# Patient Record
Sex: Male | Born: 1935 | Race: White | Hispanic: No | Marital: Married | Smoking: Former smoker
Health system: Southern US, Community
[De-identification: ages and names within clinical notes are randomized; demographics above are authoritative.]

## PROBLEM LIST (undated history)

## (undated) DIAGNOSIS — G7100 Muscular dystrophy, unspecified: Secondary | ICD-10-CM

## (undated) DIAGNOSIS — Z87891 Personal history of nicotine dependence: Secondary | ICD-10-CM

## (undated) DIAGNOSIS — F0390 Unspecified dementia without behavioral disturbance: Secondary | ICD-10-CM

## (undated) DIAGNOSIS — I2510 Atherosclerotic heart disease of native coronary artery without angina pectoris: Secondary | ICD-10-CM

## (undated) DIAGNOSIS — I10 Essential (primary) hypertension: Secondary | ICD-10-CM

## (undated) DIAGNOSIS — M1990 Unspecified osteoarthritis, unspecified site: Secondary | ICD-10-CM

## (undated) DIAGNOSIS — E785 Hyperlipidemia, unspecified: Secondary | ICD-10-CM

## (undated) DIAGNOSIS — K219 Gastro-esophageal reflux disease without esophagitis: Secondary | ICD-10-CM

## (undated) DIAGNOSIS — R972 Elevated prostate specific antigen [PSA]: Secondary | ICD-10-CM

## (undated) DIAGNOSIS — I493 Ventricular premature depolarization: Secondary | ICD-10-CM

## (undated) DIAGNOSIS — I219 Acute myocardial infarction, unspecified: Secondary | ICD-10-CM

## (undated) DIAGNOSIS — K635 Polyp of colon: Secondary | ICD-10-CM

## (undated) DIAGNOSIS — R001 Bradycardia, unspecified: Secondary | ICD-10-CM

## (undated) DIAGNOSIS — K5790 Diverticulosis of intestine, part unspecified, without perforation or abscess without bleeding: Secondary | ICD-10-CM

## (undated) HISTORY — DX: Diverticulosis of intestine, part unspecified, without perforation or abscess without bleeding: K57.90

## (undated) HISTORY — DX: Personal history of nicotine dependence: Z87.891

## (undated) HISTORY — DX: Unspecified osteoarthritis, unspecified site: M19.90

## (undated) HISTORY — DX: Muscular dystrophy, unspecified: G71.00

## (undated) HISTORY — DX: Acute myocardial infarction, unspecified: I21.9

## (undated) HISTORY — DX: Ventricular premature depolarization: I49.3

## (undated) HISTORY — DX: Elevated prostate specific antigen (PSA): R97.20

## (undated) HISTORY — DX: Polyp of colon: K63.5

## (undated) HISTORY — PX: ROTATOR CUFF REPAIR: SHX139

## (undated) HISTORY — PX: OTHER SURGICAL HISTORY: SHX169

---

## 1991-02-04 HISTORY — PX: CORONARY ARTERY BYPASS GRAFT: SHX141

## 2003-03-15 ENCOUNTER — Encounter: Admission: RE | Admit: 2003-03-15 | Discharge: 2003-03-15 | Payer: Self-pay | Admitting: Specialist

## 2003-03-17 ENCOUNTER — Inpatient Hospital Stay (HOSPITAL_COMMUNITY): Admission: RE | Admit: 2003-03-17 | Discharge: 2003-03-21 | Payer: Self-pay | Admitting: Specialist

## 2003-03-21 ENCOUNTER — Inpatient Hospital Stay (HOSPITAL_COMMUNITY)
Admission: RE | Admit: 2003-03-21 | Discharge: 2003-03-29 | Payer: Self-pay | Admitting: Physical Medicine & Rehabilitation

## 2010-05-05 HISTORY — PX: OTHER SURGICAL HISTORY: SHX169

## 2014-12-27 DIAGNOSIS — I2510 Atherosclerotic heart disease of native coronary artery without angina pectoris: Secondary | ICD-10-CM

## 2014-12-27 DIAGNOSIS — Z951 Presence of aortocoronary bypass graft: Secondary | ICD-10-CM | POA: Insufficient documentation

## 2014-12-27 DIAGNOSIS — E785 Hyperlipidemia, unspecified: Secondary | ICD-10-CM | POA: Insufficient documentation

## 2014-12-27 DIAGNOSIS — I10 Essential (primary) hypertension: Secondary | ICD-10-CM | POA: Insufficient documentation

## 2014-12-27 HISTORY — DX: Hyperlipidemia, unspecified: E78.5

## 2016-09-24 DIAGNOSIS — Z955 Presence of coronary angioplasty implant and graft: Secondary | ICD-10-CM | POA: Insufficient documentation

## 2016-11-10 DIAGNOSIS — J323 Chronic sphenoidal sinusitis: Secondary | ICD-10-CM | POA: Insufficient documentation

## 2016-11-10 DIAGNOSIS — J329 Chronic sinusitis, unspecified: Secondary | ICD-10-CM

## 2016-11-10 HISTORY — DX: Chronic sinusitis, unspecified: J32.9

## 2016-11-17 ENCOUNTER — Other Ambulatory Visit: Payer: Self-pay | Admitting: Cardiology

## 2017-01-05 DIAGNOSIS — J3802 Paralysis of vocal cords and larynx, bilateral: Secondary | ICD-10-CM | POA: Insufficient documentation

## 2017-01-05 DIAGNOSIS — G129 Spinal muscular atrophy, unspecified: Secondary | ICD-10-CM | POA: Insufficient documentation

## 2020-07-06 ENCOUNTER — Other Ambulatory Visit: Payer: Self-pay

## 2020-07-06 ENCOUNTER — Inpatient Hospital Stay (HOSPITAL_COMMUNITY)
Admission: AD | Admit: 2020-07-06 | Discharge: 2020-07-20 | DRG: 243 | Disposition: A | Discharge status: Skilled Nursing Facility | Payer: No Typology Code available for payment source | Source: Other Acute Inpatient Hospital | Attending: Family Medicine | Admitting: Family Medicine

## 2020-07-06 ENCOUNTER — Encounter (HOSPITAL_COMMUNITY): Payer: Self-pay

## 2020-07-06 DIAGNOSIS — R001 Bradycardia, unspecified: Secondary | ICD-10-CM | POA: Diagnosis present

## 2020-07-06 DIAGNOSIS — T447X5A Adverse effect of beta-adrenoreceptor antagonists, initial encounter: Secondary | ICD-10-CM | POA: Diagnosis present

## 2020-07-06 DIAGNOSIS — M25461 Effusion, right knee: Secondary | ICD-10-CM | POA: Diagnosis not present

## 2020-07-06 DIAGNOSIS — M25572 Pain in left ankle and joints of left foot: Secondary | ICD-10-CM | POA: Diagnosis not present

## 2020-07-06 DIAGNOSIS — M109 Gout, unspecified: Secondary | ICD-10-CM

## 2020-07-06 DIAGNOSIS — G7100 Muscular dystrophy, unspecified: Secondary | ICD-10-CM | POA: Diagnosis present

## 2020-07-06 DIAGNOSIS — E785 Hyperlipidemia, unspecified: Secondary | ICD-10-CM | POA: Diagnosis present

## 2020-07-06 DIAGNOSIS — M25569 Pain in unspecified knee: Secondary | ICD-10-CM

## 2020-07-06 DIAGNOSIS — I441 Atrioventricular block, second degree: Secondary | ICD-10-CM | POA: Diagnosis present

## 2020-07-06 DIAGNOSIS — K5900 Constipation, unspecified: Secondary | ICD-10-CM

## 2020-07-06 DIAGNOSIS — Z20822 Contact with and (suspected) exposure to covid-19: Secondary | ICD-10-CM | POA: Diagnosis present

## 2020-07-06 DIAGNOSIS — Z950 Presence of cardiac pacemaker: Secondary | ICD-10-CM

## 2020-07-06 DIAGNOSIS — I453 Trifascicular block: Secondary | ICD-10-CM | POA: Diagnosis present

## 2020-07-06 DIAGNOSIS — M80062A Age-related osteoporosis with current pathological fracture, left lower leg, initial encounter for fracture: Secondary | ICD-10-CM | POA: Diagnosis present

## 2020-07-06 DIAGNOSIS — I2510 Atherosclerotic heart disease of native coronary artery without angina pectoris: Secondary | ICD-10-CM | POA: Diagnosis present

## 2020-07-06 DIAGNOSIS — N400 Enlarged prostate without lower urinary tract symptoms: Secondary | ICD-10-CM | POA: Diagnosis present

## 2020-07-06 DIAGNOSIS — G250 Essential tremor: Secondary | ICD-10-CM | POA: Diagnosis present

## 2020-07-06 DIAGNOSIS — I452 Bifascicular block: Secondary | ICD-10-CM | POA: Diagnosis present

## 2020-07-06 DIAGNOSIS — R112 Nausea with vomiting, unspecified: Secondary | ICD-10-CM | POA: Diagnosis not present

## 2020-07-06 DIAGNOSIS — R55 Syncope and collapse: Secondary | ICD-10-CM | POA: Diagnosis present

## 2020-07-06 DIAGNOSIS — N390 Urinary tract infection, site not specified: Secondary | ICD-10-CM | POA: Diagnosis not present

## 2020-07-06 DIAGNOSIS — D638 Anemia in other chronic diseases classified elsewhere: Secondary | ICD-10-CM | POA: Diagnosis present

## 2020-07-06 DIAGNOSIS — Z885 Allergy status to narcotic agent status: Secondary | ICD-10-CM

## 2020-07-06 DIAGNOSIS — G129 Spinal muscular atrophy, unspecified: Secondary | ICD-10-CM | POA: Diagnosis present

## 2020-07-06 DIAGNOSIS — Z7902 Long term (current) use of antithrombotics/antiplatelets: Secondary | ICD-10-CM | POA: Diagnosis not present

## 2020-07-06 DIAGNOSIS — Z87891 Personal history of nicotine dependence: Secondary | ICD-10-CM

## 2020-07-06 DIAGNOSIS — E871 Hypo-osmolality and hyponatremia: Secondary | ICD-10-CM | POA: Diagnosis not present

## 2020-07-06 DIAGNOSIS — K219 Gastro-esophageal reflux disease without esophagitis: Secondary | ICD-10-CM | POA: Diagnosis present

## 2020-07-06 DIAGNOSIS — D72829 Elevated white blood cell count, unspecified: Secondary | ICD-10-CM | POA: Diagnosis present

## 2020-07-06 DIAGNOSIS — W050XXA Fall from non-moving wheelchair, initial encounter: Secondary | ICD-10-CM | POA: Diagnosis present

## 2020-07-06 DIAGNOSIS — M25472 Effusion, left ankle: Secondary | ICD-10-CM

## 2020-07-06 DIAGNOSIS — Y92019 Unspecified place in single-family (private) house as the place of occurrence of the external cause: Secondary | ICD-10-CM

## 2020-07-06 DIAGNOSIS — T148XXA Other injury of unspecified body region, initial encounter: Secondary | ICD-10-CM

## 2020-07-06 DIAGNOSIS — I10 Essential (primary) hypertension: Secondary | ICD-10-CM

## 2020-07-06 DIAGNOSIS — I129 Hypertensive chronic kidney disease with stage 1 through stage 4 chronic kidney disease, or unspecified chronic kidney disease: Secondary | ICD-10-CM | POA: Diagnosis present

## 2020-07-06 DIAGNOSIS — I493 Ventricular premature depolarization: Secondary | ICD-10-CM

## 2020-07-06 DIAGNOSIS — Z79899 Other long term (current) drug therapy: Secondary | ICD-10-CM

## 2020-07-06 DIAGNOSIS — R197 Diarrhea, unspecified: Secondary | ICD-10-CM | POA: Diagnosis not present

## 2020-07-06 DIAGNOSIS — R251 Tremor, unspecified: Secondary | ICD-10-CM | POA: Diagnosis not present

## 2020-07-06 DIAGNOSIS — I498 Other specified cardiac arrhythmias: Secondary | ICD-10-CM

## 2020-07-06 DIAGNOSIS — I25810 Atherosclerosis of coronary artery bypass graft(s) without angina pectoris: Secondary | ICD-10-CM

## 2020-07-06 DIAGNOSIS — S82202A Unspecified fracture of shaft of left tibia, initial encounter for closed fracture: Secondary | ICD-10-CM | POA: Diagnosis present

## 2020-07-06 DIAGNOSIS — W19XXXA Unspecified fall, initial encounter: Secondary | ICD-10-CM

## 2020-07-06 DIAGNOSIS — N1832 Chronic kidney disease, stage 3b: Secondary | ICD-10-CM | POA: Diagnosis present

## 2020-07-06 DIAGNOSIS — Z880 Allergy status to penicillin: Secondary | ICD-10-CM

## 2020-07-06 DIAGNOSIS — Z993 Dependence on wheelchair: Secondary | ICD-10-CM

## 2020-07-06 DIAGNOSIS — R110 Nausea: Secondary | ICD-10-CM

## 2020-07-06 DIAGNOSIS — F0390 Unspecified dementia without behavioral disturbance: Secondary | ICD-10-CM | POA: Diagnosis present

## 2020-07-06 DIAGNOSIS — R1010 Upper abdominal pain, unspecified: Secondary | ICD-10-CM | POA: Diagnosis not present

## 2020-07-06 HISTORY — DX: Atherosclerotic heart disease of native coronary artery without angina pectoris: I25.10

## 2020-07-06 HISTORY — DX: Essential (primary) hypertension: I10

## 2020-07-06 HISTORY — DX: Gastro-esophageal reflux disease without esophagitis: K21.9

## 2020-07-06 HISTORY — DX: Bradycardia, unspecified: R00.1

## 2020-07-06 HISTORY — DX: Hyperlipidemia, unspecified: E78.5

## 2020-07-06 HISTORY — DX: Unspecified dementia, unspecified severity, without behavioral disturbance, psychotic disturbance, mood disturbance, and anxiety: F03.90

## 2020-07-06 MED ORDER — TAMSULOSIN HCL 0.4 MG PO CAPS
0.4000 mg | ORAL_CAPSULE | Freq: Every day | ORAL | Status: DC
  Administered 2020-07-06 – 2020-07-19 (×14): 0.4 mg via ORAL
  Filled 2020-07-06 (×14): qty 1

## 2020-07-06 MED ORDER — POLYETHYLENE GLYCOL 3350 17 G PO PACK
17.0000 g | PACK | Freq: Every day | ORAL | Status: DC
  Administered 2020-07-09 – 2020-07-19 (×8): 17 g via ORAL
  Filled 2020-07-06 (×11): qty 1

## 2020-07-06 MED ORDER — FUROSEMIDE 40 MG PO TABS
40.0000 mg | ORAL_TABLET | ORAL | Status: DC
  Administered 2020-07-09 – 2020-07-11 (×2): 40 mg via ORAL
  Filled 2020-07-06 (×2): qty 1

## 2020-07-06 MED ORDER — VITAMIN B-12 1000 MCG PO TABS
1000.0000 ug | ORAL_TABLET | Freq: Every morning | ORAL | Status: DC
  Administered 2020-07-07 – 2020-07-12 (×6): 1000 ug via ORAL
  Filled 2020-07-06 (×6): qty 1

## 2020-07-06 MED ORDER — MONTELUKAST SODIUM 10 MG PO TABS
10.0000 mg | ORAL_TABLET | Freq: Every morning | ORAL | Status: DC
  Administered 2020-07-07 – 2020-07-12 (×6): 10 mg via ORAL
  Filled 2020-07-06 (×6): qty 1

## 2020-07-06 MED ORDER — CLOPIDOGREL BISULFATE 75 MG PO TABS
75.0000 mg | ORAL_TABLET | Freq: Every morning | ORAL | Status: DC
  Administered 2020-07-07 – 2020-07-12 (×6): 75 mg via ORAL
  Filled 2020-07-06 (×6): qty 1

## 2020-07-06 MED ORDER — ATORVASTATIN CALCIUM 10 MG PO TABS
20.0000 mg | ORAL_TABLET | Freq: Every morning | ORAL | Status: DC
  Administered 2020-07-07 – 2020-07-12 (×6): 20 mg via ORAL
  Filled 2020-07-06 (×6): qty 2

## 2020-07-06 MED ORDER — SODIUM CHLORIDE 0.9 % IV SOLN: 250.0000 mL | INTRAVENOUS | Status: DC | PRN

## 2020-07-06 MED ORDER — PSYLLIUM 95 % PO PACK
1.0000 | PACK | Freq: Every day | ORAL | Status: DC
  Administered 2020-07-08 – 2020-07-17 (×9): 1 via ORAL
  Filled 2020-07-06 (×15): qty 1

## 2020-07-06 MED ORDER — ONDANSETRON HCL 4 MG/2ML IJ SOLN
4.0000 mg | Freq: Four times a day (QID) | INTRAMUSCULAR | Status: DC | PRN
  Administered 2020-07-07 – 2020-07-13 (×8): 4 mg via INTRAVENOUS
  Filled 2020-07-06 (×10): qty 2

## 2020-07-06 MED ORDER — ACETAMINOPHEN 500 MG PO TABS
500.0000 mg | ORAL_TABLET | Freq: Three times a day (TID) | ORAL | Status: DC | PRN
  Administered 2020-07-07 – 2020-07-18 (×21): 500 mg via ORAL
  Filled 2020-07-06 (×22): qty 1

## 2020-07-06 MED ORDER — AMLODIPINE BESYLATE 5 MG PO TABS
5.0000 mg | ORAL_TABLET | Freq: Every morning | ORAL | Status: DC
  Administered 2020-07-07 – 2020-07-12 (×6): 5 mg via ORAL
  Filled 2020-07-06 (×6): qty 1

## 2020-07-06 MED ORDER — DONEPEZIL HCL 5 MG PO TABS
10.0000 mg | ORAL_TABLET | Freq: Two times a day (BID) | ORAL | Status: DC
  Administered 2020-07-07 – 2020-07-20 (×26): 10 mg via ORAL
  Filled 2020-07-06 (×2): qty 2
  Filled 2020-07-06: qty 1
  Filled 2020-07-06 (×3): qty 2
  Filled 2020-07-06 (×2): qty 1
  Filled 2020-07-06 (×5): qty 2
  Filled 2020-07-06: qty 1
  Filled 2020-07-06 (×4): qty 2
  Filled 2020-07-06: qty 1
  Filled 2020-07-06 (×3): qty 2
  Filled 2020-07-06: qty 1
  Filled 2020-07-06: qty 2
  Filled 2020-07-06: qty 1
  Filled 2020-07-06: qty 2
  Filled 2020-07-06: qty 1

## 2020-07-06 MED ORDER — GABAPENTIN 400 MG PO CAPS: 800.0000 mg | ORAL_CAPSULE | Freq: Every day | ORAL | Status: DC

## 2020-07-06 MED ORDER — SODIUM CHLORIDE 0.9% FLUSH
3.0000 mL | INTRAVENOUS | Status: DC | PRN
  Administered 2020-07-11: 3 mL via INTRAVENOUS

## 2020-07-06 MED ORDER — SODIUM CHLORIDE 0.9% FLUSH
3.0000 mL | Freq: Two times a day (BID) | INTRAVENOUS | Status: DC
  Administered 2020-07-06 – 2020-07-19 (×25): 3 mL via INTRAVENOUS

## 2020-07-06 NOTE — H&P (Signed)
Cardiology Admission History and Physical:   Patient ID: [name] MRN: [id number]; DOB: 1928/03/06   Admission date: 07/06/2020  PCP:  No primary care provider on file.   [facility] Providers Cardiologist:  None   {  Chief Complaint:  syncope  Patient Profile:   Frank Avila is a 87 y.o. male with CAD s/p CABG in 1993 (LIMA-LAD, SVG-OM, SVG-RCA) and PCI to SVG in 2012, hypertension, hyperlipidemia, spinal musclular atrophy who is being seen 07/07/2020 for the evaluation of syncope.  History of Present Illness:   Frank Avila was in his usual state of health until the day of admission. He was seated in his motorized wheelchair when he began to feel lightheaded and nauseous. He lost consciousness and awoke on the ground. He used a personal alert device to call emergency services.   He reports that he has had no similar events in the past.  He does not have exertional chest discomfort or dyspnea.  He has not had recent fever, chills, or flulike symptoms.  He has loss of lower extremity strength due to spinal muscular atrophy and uses a motorized wheelchair for mobility.  He was recently started on a medication for tremors, which he says is a medication often used in Parkinson's disease.  On arrival to the [facility] health emergency department in [facility] he was noted to be allergic, bradycardic and hypotensive.  He was noted to be in bigeminy with a palpable pulse rates of 20 to 30 bpm.  He received 0.5 mg of atropine with transient increase in heart rate.  A transvenous pacing wire was placed emergently.  His mental status improved.  On arrival, he reports that he feels well.  He has had no further lightheadedness, dizziness, or nausea.  He has no headache, change in vision, numbness, new weakness, or change in neurologic status.  He has a transvenous pacing wire through a right IJ introducer.  The capture threshold is 2 mV.  His cardiologist is Willette Pa, MD at [facility].    Medications Prior to Admission: Prior to Admission medications   Medication Sig Start Date End Date Taking? Authorizing Provider  acetaminophen (TYLENOL) 500 MG tablet Take 500 mg by mouth See admin instructions. Take one tablet (500 mg) by mouth three times daily - morning, supper and bedtime 12/20/19  Yes [provider]  amLODipine (NORVASC) 5 MG tablet Take 5 mg by mouth every morning. 05/28/20  Yes [provider]  atorvastatin (LIPITOR) 20 MG tablet Take 20 mg by mouth every morning. 07/05/20  Yes [provider]  cetirizine (ZYRTEC) 10 MG tablet Take 10 mg by mouth at bedtime. 11/02/19  Yes [provider]  clopidogrel (PLAVIX) 75 MG tablet Take 75 mg by mouth every morning. 05/13/20  Yes [provider]  donepezil (ARICEPT) 10 MG tablet Take 10 mg by mouth 2 (two) times daily. 06/16/20  Yes [provider]  furosemide (LASIX) 40 MG tablet Take 40 mg by mouth every Monday, Wednesday, and Friday. 06/14/20  Yes [provider]  gabapentin (NEURONTIN) 400 MG capsule Take 800 mg by mouth daily after supper. 11/02/19  Yes [provider]  montelukast (SINGULAIR) 10 MG tablet Take 10 mg by mouth every morning. 11/02/19  Yes [provider]  Multiple Vitamin (MULTIVITAMIN WITH MINERALS) TABS tablet Take 1 tablet by mouth every morning.   Yes [provider]  Omega-3 Fatty Acids (FISH OIL) 1000 MG CAPS Take 1,000 mg by  mouth See admin instructions. Take one capsule (1000 mg) by mouth three times daily - morning, supper and bedtime   Yes [provider]  Polyethylene Glycol 3350 (MIRALAX PO) Take 15 mLs by mouth See admin instructions. Mix 1 tbsp (15 ml) in 18 oz water and drink nightly (with 3 tbsp psyllium fiber)   Yes [provider]  propranolol (INDERAL) 20 MG tablet Take 20 mg by mouth every morning. For tremors 06/05/20  Yes [provider]  Psyllium (METAMUCIL  PO) Take 45 mLs by mouth See admin instructions. Mix 3 tbsp (45 ml) in 18 oz water and drink nightly (with 1 tbsp miralax powder)   Yes [provider]  tamsulosin (FLOMAX) 0.4 MG CAPS capsule Take 0.4 mg by mouth at bedtime. 03/05/20  Yes [provider]  vitamin B-12 (CYANOCOBALAMIN) 500 MCG tablet Take 1,000 mcg by mouth every morning. 06/20/20  Yes [provider]     Allergies:    Allergies  Allergen Reactions  . Codeine Other (See Comments)    Unknown reaction  . 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 He uses a motorized wheelchair for mobility. He performs his ADLs. His wife of >70 years died a few months ago.  He has no children. He is close with his niece who is here with him. He quit smoking in 1958. He enjoys singing gospel music.  Social History   Socioeconomic History  . Marital status: Married    Spouse name: Not on file  . Number of children: Not on file  . Years of education: Not on file  . Highest education level: Not on file  Occupational History  . Not on file  Tobacco Use  . Smoking status: Not on file  . Smokeless tobacco: Not on file  Substance and Sexual Activity  . Alcohol use: Not on file  . Drug use: Not on file  . Sexual activity: Not on file  Other Topics Concern  . Not on file  Social History Narrative  . Not on file   Social Determinants of Health   Financial Resource Strain: Not on file  Food Insecurity: Not on file  Transportation Needs: Not on file  Physical Activity: Not on file  Stress: Not on file  Social Connections: Not on file  Intimate Partner Violence: Not on file    Family History:  No pertinent history  ROS:  Please see the history of present illness.  All other ROS reviewed and negative.     Physical Exam/Data:   Vitals:   07/06/20 2113 07/06/20 2118 07/06/20 2130  BP:  (!) 138/57 139/60  Pulse:  63 64   Resp: 19 20 15   SpO2:  98% 98%  Weight: 79.5 kg    Height: 5\' 8"  (1.727 m)     No intake or output data in the 24 hours ending 07/07/20 0125 Last 3 Weights 07/06/2020  Weight (lbs) 175 lb 4.3 oz  Weight (kg) 79.5 kg     Body mass index is 26.65 kg/m.  General:  Well nourished, well developed, in no acute distress HEENT: normal,  Lymph: no adenopathy Neck: no JVD Endocrine:  No thryomegaly Vascular: No carotid bruits; FA pulses 2+ bilaterally without bruits  Cardiac:  normal S1, S2; RRR; no murmur, distant heart sounds  Lungs:  clear to auscultation bilaterally, no wheezing, rhonchi or rales  Abd: soft,  nontender, no hepatomegaly  Ext: 2+ edema in bilateral lower extremities  Musculoskeletal:  No deformities, BUE strength normal and equal. Markedly diminished strength in lower extremities with muscle wasting present.  Skin: warm throughout. He has a shallow laceration on his forehead and nasal bridge; both are clean and dry without surrounding erythema and no hematoma.   Neuro:  CNs 2-12 intact, no focal abnormalities noted Psych:  Normal affect   EKG:  The ECG that was done 07/06/2020 was personally reviewed and demonstrates sinus bradycardia with bigeminal PVCs.  Right bundle branch block is present.   Relevant CV Studies: Not available   Laboratory Data:  Chemistry Recent Labs  Lab 07/06/20 2338  NA 130*  K 4.4  CL 102  CO2 19*  GLUCOSE 109*  BUN 34*  CREATININE 1.87*  CALCIUM 8.2*  GFRNONAA 34*  ANIONGAP 9    Recent Labs  Lab 07/06/20 2338  PROT 6.3*  ALBUMIN 3.1*  AST 13*  ALT 13  ALKPHOS 63  BILITOT 0.9   Hematology Recent Labs  Lab 07/06/20 2338  WBC 15.2*  RBC 3.59*  HGB 11.3*  HCT 33.4*  MCV 93.0  MCH 31.5  MCHC 33.8  RDW 12.0  PLT 341   BNPNo results for input(s): BNP, PROBNP in the last 168 hours.  DDimer No results for input(s): DDIMER in the last 168 hours.  Radiology/Studies:  No results found.   Assessment and Plan:    87 year old gentleman with coronary artery disease status post CABG in 1993, hypertension, spinal muscular atrophy who had an episode of syncope today and was found to have low pulse rate with electrical bigeminy.  He has no evidence of active ischemia or decompensated heart failure. 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  Rather, his effective bradycardia was in the setting of closely coupled PVCs.  He did recently start propranolol which may contribute to low sinus rates.. However, it will be difficult to suppress the PVCs without an AV nodal blocking agent, and had clear improvement in symptomatology with the placement of a temporary pacing wire.  Furthermore, he has been pleased with the effect of propranolol on his tremors and experiencing reduction of quality life if he were not to be treated with this agent or a viable alternative.  Therefore, we will await allow the beta-blocker to washout in order to assess his heart rhythm without it.  If he has improvement of bradycardia, we will offer him the option of alternative medication for tremor or resumption of beta-blocker with placement of permanent dual-chamber pacemaker.  1. Symptomatic bradycardia and PVCs -Temporary pacing wire in right IJ.  Threshold is 2 mV, set at VVI 50 with an output of 6 mV -Hold propranolol -Transthoracic echo in the morning -He will likely need a dual-chamber pacemaker this admission  2. Coronary artery disease -Continue home atorvastatin and clopidogrel  3. Hypertension, edema -Continue home amlodipine -Lasix 40 mg 3 times weekly based on home dose  4. Leukocytosis -Chest x-ray -Urinalysis  5. Spinal muscular atrophy, tremors -Continue home medications donepezil, gabapentin -Hold propranolol for now, consider discussing with neurology whether there is an  alternative agent which will effectively treat tremors.  Risk Assessment/Risk Scores:   Severity of Illness: The appropriate patient status for this patient is INPATIENT. Inpatient status is judged to be reasonable and necessary in order to provide  the required intensity of service to ensure the patient's safety. The patient's presenting symptoms, physical exam findings, and initial radiographic and laboratory data in the context of their chronic comorbidities is felt to place them at high risk for further clinical deterioration. Furthermore, it is not anticipated that the patient will be medically stable for discharge from the hospital within 2 midnights of admission. The following factors support the patient status of inpatient.   " The patient's presenting symptoms include syncope. " The worrisome physical exam findings include altered mental status. " The initial radiographic and laboratory data are worrisome because of bradycardia, leukocytosis. " The chronic co-morbidities include coronary artery disease, spinal muscular atrophy.  * I certify that at the point of admission it is my clinical judgment that the patient will require inpatient hospital care spanning beyond 2 midnights from the point of admission due to high intensity of service, high risk for further deterioration and high frequency of surveillance required.*    For questions or updates, please contact [facility] Please consult [email or web address] for contact info under     Signed, Cynda Acres, MD  07/07/2020 1:25 AM

## 2020-07-07 ENCOUNTER — Inpatient Hospital Stay (HOSPITAL_COMMUNITY): Payer: No Typology Code available for payment source

## 2020-07-07 DIAGNOSIS — R251 Tremor, unspecified: Secondary | ICD-10-CM

## 2020-07-07 DIAGNOSIS — I441 Atrioventricular block, second degree: Secondary | ICD-10-CM

## 2020-07-07 DIAGNOSIS — R001 Bradycardia, unspecified: Secondary | ICD-10-CM

## 2020-07-07 DIAGNOSIS — D72829 Elevated white blood cell count, unspecified: Secondary | ICD-10-CM

## 2020-07-07 DIAGNOSIS — I25810 Atherosclerosis of coronary artery bypass graft(s) without angina pectoris: Secondary | ICD-10-CM

## 2020-07-07 LAB — ECHOCARDIOGRAM COMPLETE
Area-P 1/2: 2.9 cm2
Height: 68 in
S' Lateral: 2.7 cm
Weight: 2804.25 oz

## 2020-07-07 LAB — URINALYSIS, ROUTINE W REFLEX MICROSCOPIC
Bilirubin Urine: NEGATIVE
Glucose, UA: NEGATIVE mg/dL
Hgb urine dipstick: NEGATIVE
Ketones, ur: NEGATIVE mg/dL
Nitrite: NEGATIVE
Protein, ur: 100 mg/dL — AB
Specific Gravity, Urine: 1.006 (ref 1.005–1.030)
pH: 5 (ref 5.0–8.0)

## 2020-07-07 LAB — CBC WITH DIFFERENTIAL/PLATELET
Abs Immature Granulocytes: 0.07 10*3/uL (ref 0.00–0.07)
Basophils Absolute: 0 10*3/uL (ref 0.0–0.1)
Basophils Relative: 0 %
Eosinophils Absolute: 0 10*3/uL (ref 0.0–0.5)
Eosinophils Relative: 0 %
HCT: 33.4 % — ABNORMAL LOW (ref 39.0–52.0)
Hemoglobin: 11.3 g/dL — ABNORMAL LOW (ref 13.0–17.0)
Immature Granulocytes: 1 %
Lymphocytes Relative: 4 %
Lymphs Abs: 0.7 10*3/uL (ref 0.7–4.0)
MCH: 31.5 pg (ref 26.0–34.0)
MCHC: 33.8 g/dL (ref 30.0–36.0)
MCV: 93 fL (ref 80.0–100.0)
Monocytes Absolute: 0.8 10*3/uL (ref 0.1–1.0)
Monocytes Relative: 5 %
Neutro Abs: 13.7 10*3/uL — ABNORMAL HIGH (ref 1.7–7.7)
Neutrophils Relative %: 90 %
Platelets: 341 10*3/uL (ref 150–400)
RBC: 3.59 MIL/uL — ABNORMAL LOW (ref 4.22–5.81)
RDW: 12 % (ref 11.5–15.5)
WBC: 15.2 10*3/uL — ABNORMAL HIGH (ref 4.0–10.5)
nRBC: 0 % (ref 0.0–0.2)

## 2020-07-07 LAB — COMPREHENSIVE METABOLIC PANEL
ALT: 13 U/L (ref 0–44)
AST: 13 U/L — ABNORMAL LOW (ref 15–41)
Albumin: 3.1 g/dL — ABNORMAL LOW (ref 3.5–5.0)
Alkaline Phosphatase: 63 U/L (ref 38–126)
Anion gap: 9 (ref 5–15)
BUN: 34 mg/dL — ABNORMAL HIGH (ref 8–23)
CO2: 19 mmol/L — ABNORMAL LOW (ref 22–32)
Calcium: 8.2 mg/dL — ABNORMAL LOW (ref 8.9–10.3)
Chloride: 102 mmol/L (ref 98–111)
Creatinine, Ser: 1.87 mg/dL — ABNORMAL HIGH (ref 0.61–1.24)
GFR, Estimated: 34 mL/min — ABNORMAL LOW (ref >60)
Glucose, Bld: 109 mg/dL — ABNORMAL HIGH (ref 70–99)
Potassium: 4.4 mmol/L (ref 3.5–5.1)
Sodium: 130 mmol/L — ABNORMAL LOW (ref 135–145)
Total Bilirubin: 0.9 mg/dL (ref 0.3–1.2)
Total Protein: 6.3 g/dL — ABNORMAL LOW (ref 6.5–8.1)

## 2020-07-07 LAB — PROTIME-INR
INR: 1.1 (ref 0.8–1.2)
Prothrombin Time: 14 seconds (ref 11.4–15.2)

## 2020-07-07 LAB — APTT: aPTT: 27 seconds (ref 24–36)

## 2020-07-07 LAB — TSH: TSH: 1.044 u[IU]/mL (ref 0.350–4.500)

## 2020-07-07 LAB — MAGNESIUM: Magnesium: 2.1 mg/dL (ref 1.7–2.4)

## 2020-07-07 LAB — MRSA PCR SCREENING: MRSA by PCR: NEGATIVE

## 2020-07-07 MED ORDER — TRAMADOL HCL 50 MG PO TABS
50.0000 mg | ORAL_TABLET | Freq: Four times a day (QID) | ORAL | Status: DC | PRN
  Administered 2020-07-07 – 2020-07-08 (×4): 50 mg via ORAL
  Filled 2020-07-07 (×4): qty 1

## 2020-07-07 MED ORDER — CHLORHEXIDINE GLUCONATE CLOTH 2 % EX PADS
6.0000 | MEDICATED_PAD | Freq: Every day | CUTANEOUS | Status: DC
  Administered 2020-07-07 – 2020-07-19 (×11): 6 via TOPICAL

## 2020-07-07 MED ORDER — MELATONIN 3 MG PO TABS
3.0000 mg | ORAL_TABLET | Freq: Every evening | ORAL | Status: DC | PRN
  Administered 2020-07-07 – 2020-07-19 (×9): 3 mg via ORAL
  Filled 2020-07-07 (×9): qty 1

## 2020-07-07 MED ORDER — GABAPENTIN 400 MG PO CAPS
800.0000 mg | ORAL_CAPSULE | Freq: Every day | ORAL | Status: DC
  Administered 2020-07-07 – 2020-07-19 (×13): 800 mg via ORAL
  Filled 2020-07-07 (×13): qty 2

## 2020-07-07 MED ORDER — PRIMIDONE 50 MG PO TABS
12.5000 mg | ORAL_TABLET | Freq: Every day | ORAL | Status: DC
  Administered 2020-07-07: 12.5 mg via ORAL
  Filled 2020-07-07 (×2): qty 0.25

## 2020-07-07 NOTE — Plan of Care (Signed)

## 2020-07-07 NOTE — Progress Notes (Signed)
Progress Note  Patient Name: Frank Avila Date of Encounter: 07/07/2020  [facility] Cardiologist: None   Subjective   Alert . Pain left ankle.  Inpatient Medications    Scheduled Meds: . amLODipine  5 mg Oral q morning  . atorvastatin  20 mg Oral q morning  . clopidogrel  75 mg Oral q morning  . donepezil  10 mg Oral BID  . [START ON 07/09/2020] furosemide  40 mg Oral Q M,W,F  . gabapentin  800 mg Oral QPC supper  . montelukast  10 mg Oral q morning  . polyethylene glycol  17 g Oral Daily  . psyllium  1 packet Oral Daily  . sodium chloride flush  3 mL Intravenous Q12H  . tamsulosin  0.4 mg Oral QHS  . vitamin B-12  1,000 mcg Oral q morning   Continuous Infusions: . sodium chloride     PRN Meds: sodium chloride, acetaminophen, melatonin, ondansetron (ZOFRAN) IV, sodium chloride flush   Vital Signs    Vitals:   07/07/20 0730 07/07/20 0800 07/07/20 0803 07/07/20 0830  BP:  (!) 123/44    Pulse: 64 61  64  Resp: (!) 23 14  18   Temp:   97.9 F (36.6 C)   TempSrc:      SpO2: 96% 99%  98%  Weight:      Height:        Intake/Output Summary (Last 24 hours) at 07/07/2020 0910 Last data filed at 07/07/2020 0800 Gross per 24 hour  Intake 340 ml  Output 700 ml  Net -360 ml   Last 3 Weights 07/06/2020  Weight (lbs) 175 lb 4.3 oz  Weight (kg) 79.5 kg      Telemetry    Occasional V pacing, mostly NSR with 1:1 AV conduction. - Personally Reviewed  ECG    SR w 1st deg AVB, RBBB, LAD - Personally Reviewed  Physical Exam  Alert comfortable GEN: No acute distress.   Neck: No JVD Cardiac: RRR, no murmurs, rubs, or gallops.  Sternotomy scar Respiratory: Clear to auscultation bilaterally. GI: Soft, nontender, non-distended  MS: No edema; generalized muscle atrophy. Hematoma above L ankle, the ankle is mildly edematous and tender to touch Neuro:  Nonfocal  Psych: Normal affect   Labs    High Sensitivity Troponin:  No results for input(s): TROPONINIHS in the  last 720 hours.    Chemistry Recent Labs  Lab 07/06/20 2338  NA 130*  K 4.4  CL 102  CO2 19*  GLUCOSE 109*  BUN 34*  CREATININE 1.87*  CALCIUM 8.2*  PROT 6.3*  ALBUMIN 3.1*  AST 13*  ALT 13  ALKPHOS 63  BILITOT 0.9  GFRNONAA 34*  ANIONGAP 9     Hematology Recent Labs  Lab 07/06/20 2338  WBC 15.2*  RBC 3.59*  HGB 11.3*  HCT 33.4*  MCV 93.0  MCH 31.5  MCHC 33.8  RDW 12.0  PLT 341    BNPNo results for input(s): BNP, PROBNP in the last 168 hours.   DDimer No results for input(s): DDIMER in the last 168 hours.   Radiology    DG Chest Port 1 View  Result Date: 07/07/2020 CLINICAL DATA:  Hypertension and syncope EXAM: PORTABLE CHEST 1 VIEW COMPARISON:  July 06, 2020 FINDINGS: Apparent pacemaker lead from right jugular approach with tip in region of right ventricle. No edema or airspace opacity. Heart is upper normal in size with pulmonary vascularity normal. Patient is status post coronary artery bypass grafting.  There is aortic atherosclerosis. No pneumothorax. No bone lesions. IMPRESSION: Apparent pacemaker lead tip in right ventricle region. No pneumothorax. No edema or airspace opacity. Heart upper normal in size with postoperative changes. Aortic Atherosclerosis (ICD10-I70.0). Electronically Signed   By: Bretta Bang III M.D.   On: 07/07/2020 08:26   ECHOCARDIOGRAM COMPLETE  Result Date: 07/07/2020    ECHOCARDIOGRAM REPORT   Patient Name:   Frank Avila Date of Exam: 07/07/2020 Medical Rec #:  [id number]        Height:       68.0 in Accession #:    [id number]       Weight:       175.3 lb Date of Birth:  09-04-1928        BSA:          1.932 m Patient Age:    87 years         BP:           121/56 mmHg Patient Gender: M                HR:           58 bpm. Exam Location:  Inpatient Procedure: 2D Echo, Color Doppler and Cardiac Doppler Indications:    2nd Degree Heart Block i44.1  History:        Patient has prior history of Echocardiogram examinations, most                  recent 04/07/2016. CAD. Prior performed at [facility].  Sonographer:    Irving Burton Senior RDCS Referring Phys: [id number] KELLY E ARPS IMPRESSIONS  1. Left ventricular ejection fraction, by estimation, is 60 to 65%. The left ventricle has normal function. The left ventricle has no regional wall motion abnormalities. Left ventricular diastolic parameters are consistent with Grade II diastolic dysfunction (pseudonormalization). Elevated left atrial pressure.  2. Right ventricular systolic function is normal. The right ventricular size is normal.  3. The mitral valve is normal in structure. No evidence of mitral valve regurgitation. No evidence of mitral stenosis.  4. The aortic valve is normal in structure. Aortic valve regurgitation is not visualized. Mild aortic valve sclerosis is present, with no evidence of aortic valve stenosis.  5. The inferior vena cava is normal in size with greater than 50% respiratory variability, suggesting right atrial pressure of 3 mmHg. FINDINGS  Left Ventricle: Left ventricular ejection fraction, by estimation, is 60 to 65%. The left ventricle has normal function. The left ventricle has no regional wall motion abnormalities. The left ventricular internal cavity size was normal in size. There is  no left ventricular hypertrophy. Abnormal (paradoxical) septal motion, consistent with RV pacemaker. Left ventricular diastolic parameters are consistent with Grade II diastolic dysfunction (pseudonormalization). Elevated left atrial pressure. Right Ventricle: The right ventricular size is normal. No increase in right ventricular wall thickness. Right ventricular systolic function is normal. Left Atrium: Left atrial size was normal in size. Right Atrium: Right atrial size was normal in size. Pericardium: There is no evidence of pericardial effusion. Mitral Valve: The mitral valve is normal in structure. Mild to moderate mitral annular calcification. No evidence of mitral valve regurgitation. No evidence of  mitral valve stenosis. Tricuspid Valve: The tricuspid valve is normal in structure. Tricuspid valve regurgitation is not demonstrated. No evidence of tricuspid stenosis. Aortic Valve: The aortic valve is normal in structure. Aortic valve regurgitation is not visualized. Mild aortic valve sclerosis is present, with no evidence of aortic valve stenosis.  Pulmonic Valve: The pulmonic valve was normal in structure. Pulmonic valve regurgitation is not visualized. No evidence of pulmonic stenosis. Aorta: The aortic root is normal in size and structure. Venous: The inferior vena cava is normal in size with greater than 50% respiratory variability, suggesting right atrial pressure of 3 mmHg. IAS/Shunts: No atrial level shunt detected by color flow Doppler. Additional Comments: A device lead is visualized in the right ventricle.  LEFT VENTRICLE PLAX 2D LVIDd:         3.80 cm  Diastology LVIDs:         2.70 cm  LV e' medial:    5.33 cm/s LV PW:         1.00 cm  LV E/e' medial:  17.9 LV IVS:        1.40 cm  LV e' lateral:   8.38 cm/s LVOT diam:     1.80 cm  LV E/e' lateral: 11.4 LV SV:         56 LV SV Index:   29 LVOT Area:     2.54 cm  RIGHT VENTRICLE RV S prime:     9.36 cm/s TAPSE (M-mode): 1.7 cm LEFT ATRIUM             Index       RIGHT ATRIUM           Index LA diam:        2.40 cm 1.24 cm/m  RA Area:     19.10 cm LA Vol (A2C):   44.8 ml 23.19 ml/m RA Volume:   51.10 ml  26.45 ml/m LA Vol (A4C):   28.5 ml 14.75 ml/m LA Biplane Vol: 38.4 ml 19.87 ml/m  AORTIC VALVE LVOT Vmax:   99.80 cm/s LVOT Vmean:  70.800 cm/s LVOT VTI:    0.219 m  AORTA Ao Root diam: 4.00 cm MITRAL VALVE MV Area (PHT): 2.90 cm     SHUNTS MV Decel Time: 262 msec     Systemic VTI:  0.22 m MV E velocity: 95.50 cm/s   Systemic Diam: 1.80 cm MV A velocity: 101.00 cm/s MV E/A ratio:  0.95 Pamula Luther MD Electronically signed by Thurmon Fair MD Signature Date/Time: 07/07/2020/9:10:05 AM    Final     Cardiac Studies   Echo with normal LVEF, no  major valvular abnormalities, suggestion of elevated filling pressures.  Patient Profile     87 y.o. male  with CAD s/p CABG in 1993 (LIMA-LAD, SVG-OM, SVG-RCA) and PCI to SVG in 2012, hypertension, hyperlipidemia, spinal musclular atrophy, s/p syncope on 06/03 with second degree AVB, now s/p temp R IJ transvenous PM.  Assessment & Plan    1. 2nd deg AVB: propranolol DC'd (Rx for tremor). May need PPM, in which case he may need CIR or SNF since he needs upper body for transfers/mobility. 2.  CAD: asymptomatic, normal EF, on statin and clopidogrel 3. HTN: controlled. Propranolol stopped. Watch for rebound. 4. Fall/ankle injury: he is not weight bearing so not urgent, but will need ankle xray. 5. Spinal atrophy/tremors: Dr. Ladona Ridgel reached out to Neuro to see if there is an appropriate alternative to propranolol.     For questions or updates, please contact [facility] Please consult [email or web address] for contact info under        Signed, Thurmon Fair, MD  07/07/2020, 9:10 AM

## 2020-07-07 NOTE — Progress Notes (Signed)
Echocardiogram 2D Echocardiogram has been performed.  Frank Avila Frank Avila 07/07/2020, 8:29 AM

## 2020-07-07 NOTE — Consult Note (Addendum)
Cardiology Consultation:   Patient ID: Frank Avila MRN: [id number]; DOB: 22-Jul-1928  Admit date: 07/06/2020 Date of Consult: 07/07/2020  PCP:  No primary care provider on file.   [facility] Providers Cardiologist:  Dr. Kirtland Bouchard   Patient Profile:   Frank Avila is a 87 y.o. male with a hx of CAD who is being seen 07/07/2020 for the evaluation of syncope at the request of Dr. Royann Shivers.  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 He has HTN, dyslipidemia and spinal muscular atrophy. He passed out yesterday and had a palpable pulse in the 20's. He was noted to have bigeminy. He had a temp wire placed in [place]. He feels better. He has been started on propranolol for tremor which has done a nice job controlling the tremors. He has baseline RBBB and denies any remote h/o syncope. He uses a motorized wheelchair to get around.    See above.     Home Medications:  Prior to Admission medications   Medication Sig Start Date End Date Taking? Authorizing Provider  acetaminophen (TYLENOL) 500 MG tablet Take 500 mg by mouth See admin instructions. Take one tablet (500 mg) by mouth three times daily - morning, supper and bedtime 12/20/19  Yes [provider]  amLODipine (NORVASC) 5 MG tablet Take 5 mg by mouth every morning. 05/28/20  Yes [provider]  atorvastatin (LIPITOR) 20 MG tablet Take 20 mg by mouth every morning. 07/05/20  Yes [provider]  cetirizine (ZYRTEC) 10 MG tablet Take 10 mg by mouth at bedtime. 11/02/19  Yes [provider]  clopidogrel (PLAVIX) 75 MG tablet Take 75 mg by mouth every morning. 05/13/20  Yes [provider]  donepezil (ARICEPT) 10 MG tablet Take 10 mg by mouth 2 (two) times daily. 06/16/20  Yes [provider]  furosemide (LASIX) 40 MG tablet Take 40 mg by mouth every Monday, Wednesday, and Friday. 06/14/20  Yes [provider]  gabapentin (NEURONTIN)  400 MG capsule Take 800 mg by mouth daily after supper. 11/02/19  Yes [provider]  montelukast (SINGULAIR) 10 MG tablet Take 10 mg by mouth every morning. 11/02/19  Yes [provider]  Multiple Vitamin (MULTIVITAMIN WITH MINERALS) TABS tablet Take 1 tablet by mouth every morning.   Yes [provider]  Omega-3 Fatty Acids (FISH OIL) 1000 MG CAPS Take 1,000 mg by mouth See admin instructions. Take one capsule (1000 mg) by mouth three times daily - morning, supper and bedtime   Yes [provider]  Polyethylene Glycol 3350 (MIRALAX PO) Take 15 mLs by mouth See admin instructions. Mix 1 tbsp (15 ml) in 18 oz water and drink nightly (with 3 tbsp psyllium fiber)   Yes [provider]  propranolol (INDERAL) 20 MG tablet Take 20 mg by mouth every morning. For tremors 06/05/20  Yes [provider]  Psyllium (METAMUCIL PO) Take 45 mLs by mouth See admin instructions. Mix 3 tbsp (45 ml) in 18 oz water and drink nightly (with 1 tbsp miralax powder)   Yes [provider]  tamsulosin (FLOMAX) 0.4 MG CAPS capsule Take 0.4 mg by mouth at bedtime. 03/05/20  Yes [provider]  vitamin B-12 (CYANOCOBALAMIN) 500 MCG tablet Take 1,000 mcg by mouth every morning. 06/20/20  Yes [provider]    Inpatient Medications: Scheduled Meds: . amLODipine  5 mg Oral q morning  . atorvastatin  20  mg Oral q morning  . clopidogrel  75 mg Oral q morning  . donepezil  10 mg Oral BID  . [START ON 07/09/2020] furosemide  40 mg Oral Q M,W,F  . gabapentin  800 mg Oral QPC supper  . montelukast  10 mg Oral q morning  . polyethylene glycol  17 g Oral Daily  . primidone  12.5 mg Oral QHS  . psyllium  1 packet Oral Daily  . sodium chloride flush  3 mL Intravenous Q12H  . tamsulosin  0.4 mg Oral QHS  . vitamin B-12  1,000 mcg Oral q morning   Continuous Infusions: . sodium chloride     PRN Meds: sodium chloride, acetaminophen, melatonin, ondansetron  (ZOFRAN) IV, sodium chloride flush  Allergies:    Allergies  Allergen Reactions  . Codeine Other (See Comments)    Unknown reaction  . Penicillins Other (See Comments)    Unknown reaction    Social History:  [place] Recently widowed. Very remote tobacco stopped smoking over 60years ago. S[employer] in a gospel [employer].  Social History   Socioeconomic History  . Marital status: Married    Spouse name: Not on file  . Number of children: Not on file  . Years of education: Not on file  . Highest education level: Not on file  Occupational History  . Not on file  Tobacco Use  . Smoking status: Not on file  . Smokeless tobacco: Not on file  Substance and Sexual Activity  . Alcohol use: Not on file  . Drug use: Not on file  . Sexual activity: Not on file  Other Topics Concern  . Not on file  Social History Narrative  . Not on file   Social Determinants of Health   Financial Resource Strain: Not on file  Food Insecurity: Not on file  Transportation Needs: Not on file  Physical Activity: Not on file  Stress: Not on file  Social Connections: Not on file  Intimate Partner Violence: Not on file    Family History:   No premature CAD or syncope  ROS:  Please see the history of present illness.   All other ROS reviewed and negative.     Physical Exam/Data:   Vitals:   07/07/20 0730 07/07/20 0800 07/07/20 0803 07/07/20 0830  BP:  (!) 123/44    Pulse: 64 61  64  Resp: (!) 23 14  18   Temp:   97.9 F (36.6 C)   TempSrc:      SpO2: 96% 99%  98%  Weight:      Height:        Intake/Output Summary (Last 24 hours) at 07/07/2020 0940 Last data filed at 07/07/2020 0800 Gross per 24 hour  Intake 340 ml  Output 700 ml  Net -360 ml   Last 3 Weights 07/06/2020  Weight (lbs) 175 lb 4.3 oz  Weight (kg) 79.5 kg     Body mass index is 26.65 kg/m.  General:  Well nourished, well developed, in no acute distress  HEENT: normal Lymph: no adenopathy Neck: no JVD; right IJ  pacemaker wire in place. Endocrine:  No thryomegaly Vascular: No carotid bruits; FA pulses 2+ bilaterally without bruits  Cardiac:  normal S1, S2; RRR; no murmur split S2 Lungs:  clear to auscultation bilaterally, no wheezing, rhonchi or rales  Abd: soft, nontender, no hepatomegaly  Ext: no edema Musculoskeletal:  No deformities, BUE and BLE strength normal and equal Skin: warm and dry  Neuro:  CNs 2-12 intact, no focal abnormalities noted Psych:  Normal affect   EKG:  The EKG was personally reviewed and demonstrates:  NSR with RBBB Telemetry:  Telemetry was personally reviewed and demonstrates:  nsr  Relevant CV Studies: 2D echo pending  Laboratory Data:  High Sensitivity Troponin:  No results for input(s): TROPONINIHS in the last 720 hours.   Chemistry Recent Labs  Lab 07/06/20 2338  NA 130*  K 4.4  CL 102  CO2 19*  GLUCOSE 109*  BUN 34*  CREATININE 1.87*  CALCIUM 8.2*  GFRNONAA 34*  ANIONGAP 9    Recent Labs  Lab 07/06/20 2338  PROT 6.3*  ALBUMIN 3.1*  AST 13*  ALT 13  ALKPHOS 63  BILITOT 0.9   Hematology Recent Labs  Lab 07/06/20 2338  WBC 15.2*  RBC 3.59*  HGB 11.3*  HCT 33.4*  MCV 93.0  MCH 31.5  MCHC 33.8  RDW 12.0  PLT 341   BNPNo results for input(s): BNP, PROBNP in the last 168 hours.  DDimer No results for input(s): DDIMER in the last 168 hours.   Radiology/Studies:  DG Chest Port 1 View  Result Date: 07/07/2020 CLINICAL DATA:  Hypertension and syncope EXAM: PORTABLE CHEST 1 VIEW COMPARISON:  July 06, 2020 FINDINGS: Apparent pacemaker lead from right jugular approach with tip in region of right ventricle. No edema or airspace opacity. Heart is upper normal in size with pulmonary vascularity normal. Patient is status post coronary artery bypass grafting. There is aortic atherosclerosis. No pneumothorax. No bone lesions. IMPRESSION: Apparent pacemaker lead tip in right ventricle region. No pneumothorax. No edema or airspace opacity. Heart  upper normal in size with postoperative changes. Aortic Atherosclerosis (ICD10-I70.0). Electronically Signed   By: Bretta Bang III M.D.   On: 07/07/2020 08:26   ECHOCARDIOGRAM COMPLETE  Result Date: 07/07/2020    ECHOCARDIOGRAM REPORT   Patient Name:   FLOR HOUDESHELL Date of Exam: 07/07/2020 Medical Rec #:  [id number]        Height:       68.0 in Accession #:    [id number]       Weight:       175.3 lb Date of Birth:  February 23, 1928        BSA:          1.932 m Patient Age:    89 years         BP:           121/56 mmHg Patient Gender: M                HR:           58 bpm. Exam Location:  Inpatient Procedure: 2D Echo, Color Doppler and Cardiac Doppler Indications:    2nd Degree Heart Block i44.1  History:        Patient has prior history of Echocardiogram examinations, most                 recent 04/07/2016. CAD. Prior performed at [facility].  Sonographer:    Irving Burton Senior RDCS Referring Phys: [id number] KELLY E ARPS IMPRESSIONS  1. Left ventricular ejection fraction, by estimation, is 60 to 65%. The left ventricle has normal function. The left ventricle has no regional wall motion abnormalities. Left ventricular diastolic parameters are consistent with Grade II diastolic dysfunction (pseudonormalization). Elevated left atrial pressure.  2. Right ventricular systolic function is normal. The right ventricular size is normal.  3. The mitral valve is normal in structure.  No evidence of mitral valve regurgitation. No evidence of mitral stenosis.  4. The aortic valve is normal in structure. Aortic valve regurgitation is not visualized. Mild aortic valve sclerosis is present, with no evidence of aortic valve stenosis.  5. The inferior vena cava is normal in size with greater than 50% respiratory variability, suggesting right atrial pressure of 3 mmHg. FINDINGS  Left Ventricle: Left ventricular ejection fraction, by estimation, is 60 to 65%. The left ventricle has normal function. The left ventricle has no regional wall motion  abnormalities. The left ventricular internal cavity size was normal in size. There is  no left ventricular hypertrophy. Abnormal (paradoxical) septal motion, consistent with RV pacemaker. Left ventricular diastolic parameters are consistent with Grade II diastolic dysfunction (pseudonormalization). Elevated left atrial pressure. Right Ventricle: The right ventricular size is normal. No increase in right ventricular wall thickness. Right ventricular systolic function is normal. Left Atrium: Left atrial size was normal in size. Right Atrium: Right atrial size was normal in size. Pericardium: There is no evidence of pericardial effusion. Mitral Valve: The mitral valve is normal in structure. Mild to moderate mitral annular calcification. No evidence of mitral valve regurgitation. No evidence of mitral valve stenosis. Tricuspid Valve: The tricuspid valve is normal in structure. Tricuspid valve regurgitation is not demonstrated. No evidence of tricuspid stenosis. Aortic Valve: The aortic valve is normal in structure. Aortic valve regurgitation is not visualized. Mild aortic valve sclerosis is present, with no evidence of aortic valve stenosis. Pulmonic Valve: The pulmonic valve was normal in structure. Pulmonic valve regurgitation is not visualized. No evidence of pulmonic stenosis. Aorta: The aortic root is normal in size and structure. Venous: The inferior vena cava is normal in size with greater than 50% respiratory variability, suggesting right atrial pressure of 3 mmHg. IAS/Shunts: No atrial level shunt detected by color flow Doppler. Additional Comments: A device lead is visualized in the right ventricle.  LEFT VENTRICLE PLAX 2D LVIDd:         3.80 cm  Diastology LVIDs:         2.70 cm  LV e' medial:    5.33 cm/s LV PW:         1.00 cm  LV E/e' medial:  17.9 LV IVS:        1.40 cm  LV e' lateral:   8.38 cm/s LVOT diam:     1.80 cm  LV E/e' lateral: 11.4 LV SV:         56 LV SV Index:   29 LVOT Area:     2.54 cm   RIGHT VENTRICLE RV S prime:     9.36 cm/s TAPSE (M-mode): 1.7 cm LEFT ATRIUM             Index       RIGHT ATRIUM           Index LA diam:        2.40 cm 1.24 cm/m  RA Area:     19.10 cm LA Vol (A2C):   44.8 ml 23.19 ml/m RA Volume:   51.10 ml  26.45 ml/m LA Vol (A4C):   28.5 ml 14.75 ml/m LA Biplane Vol: 38.4 ml 19.87 ml/m  AORTIC VALVE LVOT Vmax:   99.80 cm/s LVOT Vmean:  70.800 cm/s LVOT VTI:    0.219 m  AORTA Ao Root diam: 4.00 cm MITRAL VALVE MV Area (PHT): 2.90 cm     SHUNTS MV Decel Time: 262 msec     Systemic VTI:  0.22 m MV E velocity: 95.50 cm/s   Systemic Diam: 1.80 cm MV A velocity: 101.00 cm/s MV E/A ratio:  0.95 Mihai Croitoru MD Electronically signed by Thurmon Fair MD Signature Date/Time: 07/07/2020/9:10:05 AM    Final      Assessment and Plan:   1. Syncope - I suspect that this is due to the propranolol in the setting of underlying conduction system disease. His propranolol has been stopped. No additional bradycardia 2. Tremor - I asked the neurology team to see and they have recommended starting low dose primadone. Outpatient uptitration. Hopefully the tremors can be controlled.  3. Bradycardia - if no additional heart block then he will not need PPM. I would turn down the temp wire to 30 and remove tomorrow and DC home on Monday. Could transfer to tele tomorrow if no additional bradycardia.    Discussed with Dr. Parke Poisson and Croitoru  For questions or updates, please contact [facility] Please consult [email or web address] for contact info under    Signed, Lewayne Bunting, MD  07/07/2020 9:40 AM

## 2020-07-08 ENCOUNTER — Inpatient Hospital Stay (HOSPITAL_COMMUNITY): Payer: No Typology Code available for payment source

## 2020-07-08 DIAGNOSIS — I453 Trifascicular block: Secondary | ICD-10-CM

## 2020-07-08 MED ORDER — PRIMIDONE 50 MG PO TABS
25.0000 mg | ORAL_TABLET | Freq: Every day | ORAL | Status: DC
  Administered 2020-07-08 – 2020-07-19 (×12): 25 mg via ORAL
  Filled 2020-07-08 (×13): qty 0.5

## 2020-07-08 NOTE — Progress Notes (Signed)
Progress Note  Patient Name: Frank Avila Date of Encounter: 07/08/2020  [facility] Cardiologist: None   Subjective   He has no cardiovascular complaints.  The left ankle is still very painful. Transvenous pacemaker was turned down to 30 bpm and he has not required any ventricular pacing. No longer receiving propranolol.  He is not sure whether the primidone is helping as much as the propranolol yet, "it is too soon to tell".  He does not appear to have problems with excessive sedation. X-ray shows distal left tibial spiral fracture with bones in near anatomic alignment and osteoporosis, also a left ankle joint effusion, possible superimposed ligamentous injury.  Inpatient Medications    Scheduled Meds: . amLODipine  5 mg Oral q morning  . atorvastatin  20 mg Oral q morning  . Chlorhexidine Gluconate Cloth  6 each Topical Daily  . clopidogrel  75 mg Oral q morning  . donepezil  10 mg Oral BID  . [START ON 07/09/2020] furosemide  40 mg Oral Q M,W,F  . gabapentin  800 mg Oral QPC supper  . montelukast  10 mg Oral q morning  . polyethylene glycol  17 g Oral Daily  . primidone  12.5 mg Oral QHS  . psyllium  1 packet Oral Daily  . sodium chloride flush  3 mL Intravenous Q12H  . tamsulosin  0.4 mg Oral QHS  . vitamin B-12  1,000 mcg Oral q morning   Continuous Infusions: . sodium chloride     PRN Meds: sodium chloride, acetaminophen, melatonin, ondansetron (ZOFRAN) IV, sodium chloride flush, traMADol   Vital Signs    Vitals:   07/08/20 0804 07/08/20 0830 07/08/20 0900 07/08/20 1100  BP:   (!) 139/54   Pulse:  67 62   Resp:  (!) 24 14   Temp: 97.9 F (36.6 C)   97.7 F (36.5 C)  TempSrc: Oral   Oral  SpO2:  99% 94%   Weight:      Height:        Intake/Output Summary (Last 24 hours) at 07/08/2020 1250 Last data filed at 07/08/2020 1200 Gross per 24 hour  Intake 660 ml  Output 880 ml  Net -220 ml   Last 3 Weights 07/06/2020  Weight (lbs) 175 lb 4.3 oz  Weight  (kg) 79.5 kg      Telemetry    Sinus rhythm with 1: 1 AV conduction, occasional PVCs, no evidence of AV block- Personally Reviewed  ECG    Sinus rhythm with long PR, right bundle branch block and left anterior fascicular block- Personally Reviewed  Physical Exam  Looks comfortable GEN: No acute distress.   Neck: No JVD Cardiac: RRR, no murmurs, rubs, or gallops.  Respiratory: Clear to auscultation bilaterally. GI: Soft, nontender, non-distended  MS: hematoma left shin, swollen left ankle Neuro:  Nonfocal  Psych: Normal affect   Labs    High Sensitivity Troponin:  No results for input(s): TROPONINIHS in the last 720 hours.    Chemistry Recent Labs  Lab 07/06/20 2338  NA 130*  K 4.4  CL 102  CO2 19*  GLUCOSE 109*  BUN 34*  CREATININE 1.87*  CALCIUM 8.2*  PROT 6.3*  ALBUMIN 3.1*  AST 13*  ALT 13  ALKPHOS 63  BILITOT 0.9  GFRNONAA 34*  ANIONGAP 9     Hematology Recent Labs  Lab 07/06/20 2338  WBC 15.2*  RBC 3.59*  HGB 11.3*  HCT 33.4*  MCV 93.0  MCH 31.5  MCHC 33.8  RDW 12.0  PLT 341    BNPNo results for input(s): BNP, PROBNP in the last 168 hours.   DDimer No results for input(s): DDIMER in the last 168 hours.   Radiology    DG Chest Port 1 View  Result Date: 07/07/2020 CLINICAL DATA:  Hypertension and syncope EXAM: PORTABLE CHEST 1 VIEW COMPARISON:  July 06, 2020 FINDINGS: Apparent pacemaker lead from right jugular approach with tip in region of right ventricle. No edema or airspace opacity. Heart is upper normal in size with pulmonary vascularity normal. Patient is status post coronary artery bypass grafting. There is aortic atherosclerosis. No pneumothorax. No bone lesions. IMPRESSION: Apparent pacemaker lead tip in right ventricle region. No pneumothorax. No edema or airspace opacity. Heart upper normal in size with postoperative changes. Aortic Atherosclerosis (ICD10-I70.0). Electronically Signed   By: Bretta BangWilliam  Woodruff III M.D.   On: 07/07/2020  08:26   DG Ankle Left Port  Result Date: 07/08/2020 CLINICAL DATA:  Pain and swelling EXAM: PORTABLE LEFT ANKLE - 2 VIEW COMPARISON:  None. FINDINGS: Frontal and lateral views were obtained. Underlying osteoporosis. There is a spiral fracture of the distal tibial diaphysis extending to the level of the diaphysis-metaphysis junction. Alignment near anatomic. No other fracture. There is a joint effusion. There is moderate generalized joint space narrowing. No erosion. Ankle mortise appears intact. There are multiple foci of arterial vascular calcification. IMPRESSION: Osteoporosis. Spiral fracture distal tibia with alignment near anatomic. No other fracture. There is a joint effusion which potentially could indicate superimposed ligamentous injury. Generalized joint space narrowing. Multiple foci of arterial vascular calcification noted. These results will be called to the ordering clinician or representative by the Radiologist Assistant, and communication documented in the PACS or [employer] Dashboard. Electronically Signed   By: Bretta BangWilliam  Woodruff III M.D.   On: 07/08/2020 12:11   ECHOCARDIOGRAM COMPLETE  Result Date: 07/07/2020    ECHOCARDIOGRAM REPORT   Patient Name:   Collie SiadWALTER M Avila Date of Exam: 07/07/2020 Medical Rec #:  [id number]        Height:       68.0 in Accession #:    [id number]       Weight:       175.3 lb Date of Birth:  [birth year]        BSA:          1.932 m Patient Age:    87 years         BP:           121/56 mmHg Patient Gender: M                HR:           58 bpm. Exam Location:  Inpatient Procedure: 2D Echo, Color Doppler and Cardiac Doppler Indications:    2nd Degree Heart Block i44.1  History:        Patient has prior history of Echocardiogram examinations, most                 recent 04/07/2016. CAD[facility]  Sonographer:    Irving BurtonEmily Senior RDCS Referring Phys: [id number] KELLY E ARPS IMPRESSIONS  1. Left ventricular ejection fraction, by estimation, is 60 to 65%. The left  ventricle has normal function. The left ventricle has no regional wall motion abnormalities. Left ventricular diastolic parameters are consistent with Grade II diastolic dysfunction (pseudonormalization). Elevated left atrial pressure.  2. Right ventricular systolic function is normal. The right ventricular size is  normal.  3. The mitral valve is normal in structure. No evidence of mitral valve regurgitation. No evidence of mitral stenosis.  4. The aortic valve is normal in structure. Aortic valve regurgitation is not visualized. Mild aortic valve sclerosis is present, with no evidence of aortic valve stenosis.  5. The inferior vena cava is normal in size with greater than 50% respiratory variability, suggesting right atrial pressure of 3 mmHg. FINDINGS  Left Ventricle: Left ventricular ejection fraction, by estimation, is 60 to 65%. The left ventricle has normal function. The left ventricle has no regional wall motion abnormalities. The left ventricular internal cavity size was normal in size. There is  no left ventricular hypertrophy. Abnormal (paradoxical) septal motion, consistent with RV pacemaker. Left ventricular diastolic parameters are consistent with Grade II diastolic dysfunction (pseudonormalization). Elevated left atrial pressure. Right Ventricle: The right ventricular size is normal. No increase in right ventricular wall thickness. Right ventricular systolic function is normal. Left Atrium: Left atrial size was normal in size. Right Atrium: Right atrial size was normal in size. Pericardium: There is no evidence of pericardial effusion. Mitral Valve: The mitral valve is normal in structure. Mild to moderate mitral annular calcification. No evidence of mitral valve regurgitation. No evidence of mitral valve stenosis. Tricuspid Valve: The tricuspid valve is normal in structure. Tricuspid valve regurgitation is not demonstrated. No evidence of tricuspid stenosis. Aortic Valve: The aortic valve is normal in  structure. Aortic valve regurgitation is not visualized. Mild aortic valve sclerosis is present, with no evidence of aortic valve stenosis. Pulmonic Valve: The pulmonic valve was normal in structure. Pulmonic valve regurgitation is not visualized. No evidence of pulmonic stenosis. Aorta: The aortic root is normal in size and structure. Venous: The inferior vena cava is normal in size with greater than 50% respiratory variability, suggesting right atrial pressure of 3 mmHg. IAS/Shunts: No atrial level shunt detected by color flow Doppler. Additional Comments: A device lead is visualized in the right ventricle.  LEFT VENTRICLE PLAX 2D LVIDd:         3.80 cm  Diastology LVIDs:         2.70 cm  LV e' medial:    5.33 cm/s LV PW:         1.00 cm  LV E/e' medial:  17.9 LV IVS:        1.40 cm  LV e' lateral:   8.38 cm/s LVOT diam:     1.80 cm  LV E/e' lateral: 11.4 LV SV:         56 LV SV Index:   29 LVOT Area:     2.54 cm  RIGHT VENTRICLE RV S prime:     9.36 cm/s TAPSE (M-mode): 1.7 cm LEFT ATRIUM             Index       RIGHT ATRIUM           Index LA diam:        2.40 cm 1.24 cm/m  RA Area:     19.10 cm LA Vol (A2C):   44.8 ml 23.19 ml/m RA Volume:   51.10 ml  26.45 ml/m LA Vol (A4C):   28.5 ml 14.75 ml/m LA Biplane Vol: 38.4 ml 19.87 ml/m  AORTIC VALVE LVOT Vmax:   99.80 cm/s LVOT Vmean:  70.800 cm/s LVOT VTI:    0.219 m  AORTA Ao Root diam: 4.00 cm MITRAL VALVE MV Area (PHT): 2.90 cm     SHUNTS MV Decel Time:  262 msec     Systemic VTI:  0.22 m MV E velocity: 95.50 cm/s   Systemic Diam: 1.80 cm MV A velocity: 101.00 cm/s MV E/A ratio:  0.95 Dorman Calderwood MD Electronically signed by Thurmon Fair MD Signature Date/Time: 07/07/2020/9:10:05 AM    Final     Cardiac Studies  Echo  1. Left ventricular ejection fraction, by estimation, is 60 to 65%. The left ventricle has normal function. The left ventricle has no regional wall motion abnormalities. Left ventricular diastolic parameters are consistent with Grade  II diastolic dysfunction (pseudonormalization). Elevated left atrial pressure.   2. Right ventricular systolic function is normal. The right ventricular size is normal.  3. The mitral valve is normal in structure. No evidence of mitral valve regurgitation. No evidence of mitral stenosis.   4. The aortic valve is normal in structure. Aortic valve regurgitation is not visualized. Mild aortic valve sclerosis is present, with no evidence of aortic valve stenosis.   5. The inferior vena cava is normal in size with greater than 50% respiratory variability, suggesting right atrial pressure of 3 mmHg.   Patient Profile     87 y.o. male with CAD s/p CABG in 1993 (LIMA-LAD, SVG-OM, SVG-RCA) and PCI to SVG in 2012, hypertension, hyperlipidemia, spinal musclular atrophy, s/p syncope on 06/03 with second degree AVB, now s/p temp R IJ transvenous PM.   Assessment & Plan      1. 2nd deg AVB:  Once propranolol was discontinued, he no longer has evidence of second-degree AV block.  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  Discussed with Dr. Ladona Ridgel.  We will discontinue his transvenous pacemaker.   2.  CAD:  Remains asymptomatic, normal EF, on statin and clopidogrel 3. HTN: controlled. Propranolol stopped.  No evidence of hypertension rebound 4. Fall/ankle injury:  Old fracture of the distal tibia.  Will obtain orthopedic consultation.  He is not weightbearing due to his neurological problems, but will require fracture stabilization for healing and pain control. 5. Spinal atrophy/tremors:  Started on primidone.  This can be titrated as an outpatient.         For questions or updates, please contact [facility] Please consult [email or web address] for contact info under        Signed, Thurmon Fair, MD  07/08/2020, 12:50 PM

## 2020-07-08 NOTE — Progress Notes (Signed)
[facility]  Aware of consult Ordered CT for further evaluation  Formal consult to follow  NWB L leg  Ice  Will order splint    Mearl Latin, PA-C [phone] (C) 07/08/2020, 2:27 PM  [facility] [address] [place] [address] [phone] Frank Avila (F)

## 2020-07-08 NOTE — Progress Notes (Signed)
Orthopaedic Trauma Service (OTS) Consult   Patient ID: Frank Avila MRN: [id number] DOB/AGE: 09/26/1928 87 y.o.   Reason for Consult: Left distal tibia fracture Referring Physician: Thurmon Fair, MD   HPI: Frank Avila is an 87 y.o. male with history of CAD s/p CABG, spinal muscular atrophy, hypertension, wheelchair dependent who evidently sustained a syncopal episode on 07/06/2020.  Patient was in his wheelchair when this occurred and slid out of his wheelchair and got his leg left caught under him.  He was brought to [facility] and was admitted to the cardiology service.  During his hospital course he started to complain of some left lower leg pain.  X-rays were obtained which did demonstrate a mildly displaced left distal tibia fracture.  Orthopedics consulted for recommendations and management.  Patient seen and evaluated on the 2 heart unit.  He is resting comfortably does complain of a little bit of pain in his left leg.  Again he states that he is wheelchair dependent and really only does slide transfers.  Occasionally he will use his right leg to help with transfers but does not use his left leg at all as he states it is severely affected by his spinal muscular atrophy  Historical information is reviewed and is part of his epic chart  Social History:  has no history on file for tobacco use, alcohol use, and drug use.  Allergies:  Allergies  Allergen Reactions  . Codeine Other (See Comments)    Unknown reaction  . Penicillins Other (See Comments)    Unknown reaction    Medications: I have reviewed the patient's current medications.  Results for orders placed or performed during the hospital encounter of 07/06/20 (from the past 48 hour(s))  CBC WITH DIFFERENTIAL     Status: Abnormal   Collection Time: 07/06/20 11:38 PM  Result Value Ref Range   WBC 15.2 (H) 4.0 - 10.5 K/uL   RBC 3.59 (L) 4.22 - 5.81 MIL/uL   Hemoglobin 11.3 (L) 13.0 - 17.0 g/dL    HCT 40.9 (L) 81.1 - 52.0 %   MCV 93.0 80.0 - 100.0 fL   MCH 31.5 26.0 - 34.0 pg   MCHC 33.8 30.0 - 36.0 g/dL   RDW 91.4 78.2 - 95.6 %   Platelets 341 150 - 400 K/uL   nRBC 0.0 0.0 - 0.2 %   Neutrophils Relative % 90 %   Neutro Abs 13.7 (H) 1.7 - 7.7 K/uL   Lymphocytes Relative 4 %   Lymphs Abs 0.7 0.7 - 4.0 K/uL   Monocytes Relative 5 %   Monocytes Absolute 0.8 0.1 - 1.0 K/uL   Eosinophils Relative 0 %   Eosinophils Absolute 0.0 0.0 - 0.5 K/uL   Basophils Relative 0 %   Basophils Absolute 0.0 0.0 - 0.1 K/uL   Immature Granulocytes 1 %   Abs Immature Granulocytes 0.07 0.00 - 0.07 K/uL    Comment: Performed at [facility], [address], [place], [place] [address]  Comprehensive metabolic panel     Status: Abnormal   Collection Time: 07/06/20 11:38 PM  Result Value Ref Range   Sodium 130 (L) 135 - 145 mmol/L   Potassium 4.4 3.5 - 5.1 mmol/L   Chloride 102 98 - 111 mmol/L   CO2 19 (L) 22 - 32 mmol/L   Glucose, Bld 109 (H) 70 - 99 mg/dL    Comment: Glucose reference range applies only to samples taken  after fasting for at least 8 hours.   BUN 34 (H) 8 - 23 mg/dL   Creatinine, Ser 2.42 (H) 0.61 - 1.24 mg/dL   Calcium 8.2 (L) 8.9 - 10.3 mg/dL   Total Protein 6.3 (L) 6.5 - 8.1 g/dL   Albumin 3.1 (L) 3.5 - 5.0 g/dL   AST 13 (L) 15 - 41 U/L   ALT 13 0 - 44 U/L   Alkaline Phosphatase 63 38 - 126 U/L   Total Bilirubin 0.9 0.3 - 1.2 mg/dL   GFR, Estimated 34 (L) >60 mL/min    Comment: (NOTE) Calculated using the CKD-EPI Creatinine Equation (2021)    Anion gap 9 5 - 15    Comment: Performed at [facility], [address] [place], [place] [address]  Protime-INR     Status: None   Collection Time: 07/06/20 11:38 PM  Result Value Ref Range   Prothrombin Time 14.0 11.4 - 15.2 seconds   INR 1.1 0.8 - 1.2    Comment: (NOTE) INR goal varies based on device and disease states. Performed at [facility], [address], [place], [place] [address]   Magnesium      Status: None   Collection Time: 07/06/20 11:38 PM  Result Value Ref Range   Magnesium 2.1 1.7 - 2.4 mg/dL    Comment: Performed at [facility], [address], [place], [place] [address]  APTT     Status: None   Collection Time: 07/06/20 11:38 PM  Result Value Ref Range   aPTT 27 24 - 36 seconds    Comment: Performed at [facility], [address], [place], [place] [address]  TSH     Status: None   Collection Time: 07/06/20 11:38 PM  Result Value Ref Range   TSH 1.044 0.350 - 4.500 uIU/mL    Comment: Performed by a 3rd Generation assay with a functional sensitivity of <=0.01 uIU/mL. Performed at [facility], [address] [place], [place] [address]   Urinalysis, Routine w reflex microscopic Urine, Clean Catch     Status: Abnormal   Collection Time: 07/07/20  6:04 AM  Result Value Ref Range   Color, Urine YELLOW YELLOW   APPearance HAZY (A) CLEAR   Specific Gravity, Urine 1.006 1.005 - 1.030   pH 5.0 5.0 - 8.0   Glucose, UA NEGATIVE NEGATIVE mg/dL   Hgb urine dipstick NEGATIVE NEGATIVE   Bilirubin Urine NEGATIVE NEGATIVE   Ketones, ur NEGATIVE NEGATIVE mg/dL   Protein, ur 144 (A) NEGATIVE mg/dL   Nitrite NEGATIVE NEGATIVE   Leukocytes,Ua SMALL (A) NEGATIVE   RBC / HPF 0-5 0 - 5 RBC/hpf   WBC, UA 6-10 0 - 5 WBC/hpf   Bacteria, UA FEW (A) NONE SEEN    Comment: Performed at [facility], [address] [place], [place] [address]  MRSA PCR Screening     Status: None   Collection Time: 07/07/20  6:09 AM   Specimen: Urine, Clean Catch; Nasopharyngeal  Result Value Ref Range   MRSA by PCR NEGATIVE NEGATIVE    Comment:        The GeneXpert MRSA Assay (FDA approved for NASAL specimens only), is one component of a comprehensive MRSA colonization surveillance program. It is not intended to diagnose MRSA infection nor to guide or monitor treatment for MRSA infections. Performed at [facility], [address], [place], [place] [address]      CT ANKLE  LEFT WO CONTRAST  Result Date: 07/08/2020 CLINICAL DATA:  Distal tibial fracture. EXAM: CT OF THE LEFT ANKLE WITHOUT CONTRAST TECHNIQUE: Multidetector CT imaging of the left ankle was performed according to the standard protocol. Multiplanar CT image reconstructions were also generated. COMPARISON:  Radiograph 07/08/2020 and CT scan 08/31/2017 FINDINGS: Bones/Joint/Cartilage Acute oblique fracture extending from the medial distal tibial diaphysis into the lateral distal tibial metaphysis along the margin of the proximal tibiofibular articulation. Nondisplaced transverse fracture of the distal fibula metaphysis. No definite fracture involvement of the tibial plafond and. There is some chronic deformity of the medial malleolus not appreciably changed from 08/31/2017 probably related to an old injury. Abnormal hyperdense nodularity in the tibiotalar joint, talofibular joint, and distal tibiofibular articulation as shown on the 08/31/2017 exam, probably reflecting pigmented villonodular synovitis, and associated with some subtle erosive findings. Degenerative loss of articular space along the subtalar joint. Ligaments Suboptimally assessed by CT. Muscles and Tendons No tendon entrapment. Severe atrophy of the visualized musculature in the mid and lower calf. Soft tissues Subcutaneous edema along the distal calf and dorsally in the ankle extending into the foot. Atherosclerotic vascular calcifications. Clips along the medial calf likely from prior saphenous vein harvest. IMPRESSION: 1. Acute oblique fracture extending from the medial distal tibial diaphysis to the lateral distal tibial metaphysis. 2. Acute nondisplaced transverse fracture of the distal fibular metaphysis. 3. Chronic hyperdense nodularity in the ankle joint compatible with pigmented villonodular synovitis. 4. Severe muscular atrophy in the calf. 5. Subcutaneous edema in the distal calf, ankle, and dorsal foot. 6. Atherosclerosis.  Electronically Signed   By: Gaylyn RongWalter  Liebkemann M.D.   On: 07/08/2020 15:25   DG Chest Port 1 View  Result Date: 07/07/2020 CLINICAL DATA:  Hypertension and syncope EXAM: PORTABLE CHEST 1 VIEW COMPARISON:  July 06, 2020 FINDINGS: Apparent pacemaker lead from right jugular approach with tip in region of right ventricle. No edema or airspace opacity. Heart is upper normal in size with pulmonary vascularity normal. Patient is status post coronary artery bypass grafting. There is aortic atherosclerosis. No pneumothorax. No bone lesions. IMPRESSION: Apparent pacemaker lead tip in right ventricle region. No pneumothorax. No edema or airspace opacity. Heart upper normal in size with postoperative changes. Aortic Atherosclerosis (ICD10-I70.0). Electronically Signed   By: Bretta BangWilliam  Woodruff III M.D.   On: 07/07/2020 08:26   DG Ankle Left Port  Result Date: 07/08/2020 CLINICAL DATA:  Pain and swelling EXAM: PORTABLE LEFT ANKLE - 2 VIEW COMPARISON:  None. FINDINGS: Frontal and lateral views were obtained. Underlying osteoporosis. There is a spiral fracture of the distal tibial diaphysis extending to the level of the diaphysis-metaphysis junction. Alignment near anatomic. No other fracture. There is a joint effusion. There is moderate generalized joint space narrowing. No erosion. Ankle mortise appears intact. There are multiple foci of arterial vascular calcification. IMPRESSION: Osteoporosis. Spiral fracture distal tibia with alignment near anatomic. No other fracture. There is a joint effusion which potentially could indicate superimposed ligamentous injury. Generalized joint space narrowing. Multiple foci of arterial vascular calcification noted. These results will be called to the ordering clinician or representative by the Radiologist Assistant, and communication documented in the PACS or [employer] Dashboard. Electronically Signed   By: Bretta BangWilliam  Woodruff III M.D.   On: 07/08/2020 12:11   ECHOCARDIOGRAM  COMPLETE  Result Date: 07/07/2020    ECHOCARDIOGRAM REPORT   Patient Name:   Collie SiadWALTER M Quintanilla Date of Exam: 07/07/2020 Medical Rec #:  [id number]        Height:  68.0 in Accession #:    [id number]       Weight:       175.3 lb Date of Birth:  [birth year]        BSA:          1.932 m Patient Age:    89 years         BP:           121/56 mmHg Patient Gender: M                HR:           58 bpm. Exam Location:  Inpatient Procedure: 2D Echo, Color Doppler and Cardiac Doppler Indications:    2nd Degree Heart Block i44.1  History:        Patient has prior history of Echocardiogram examinations, most                 recent 04/07/2016. CAD. Prior performed at [facility].  Sonographer:    Irving Burton Senior RDCS Referring Phys: [id number] KELLY E ARPS IMPRESSIONS  1. Left ventricular ejection fraction, by estimation, is 60 to 65%. The left ventricle has normal function. The left ventricle has no regional wall motion abnormalities. Left ventricular diastolic parameters are consistent with Grade II diastolic dysfunction (pseudonormalization). Elevated left atrial pressure.  2. Right ventricular systolic function is normal. The right ventricular size is normal.  3. The mitral valve is normal in structure. No evidence of mitral valve regurgitation. No evidence of mitral stenosis.  4. The aortic valve is normal in structure. Aortic valve regurgitation is not visualized. Mild aortic valve sclerosis is present, with no evidence of aortic valve stenosis.  5. The inferior vena cava is normal in size with greater than 50% respiratory variability, suggesting right atrial pressure of 3 mmHg. FINDINGS  Left Ventricle: Left ventricular ejection fraction, by estimation, is 60 to 65%. The left ventricle has normal function. The left ventricle has no regional wall motion abnormalities. The left ventricular internal cavity size was normal in size. There is  no left ventricular hypertrophy. Abnormal (paradoxical) septal motion, consistent with RV pacemaker.  Left ventricular diastolic parameters are consistent with Grade II diastolic dysfunction (pseudonormalization). Elevated left atrial pressure. Right Ventricle: The right ventricular size is normal. No increase in right ventricular wall thickness. Right ventricular systolic function is normal. Left Atrium: Left atrial size was normal in size. Right Atrium: Right atrial size was normal in size. Pericardium: There is no evidence of pericardial effusion. Mitral Valve: The mitral valve is normal in structure. Mild to moderate mitral annular calcification. No evidence of mitral valve regurgitation. No evidence of mitral valve stenosis. Tricuspid Valve: The tricuspid valve is normal in structure. Tricuspid valve regurgitation is not demonstrated. No evidence of tricuspid stenosis. Aortic Valve: The aortic valve is normal in structure. Aortic valve regurgitation is not visualized. Mild aortic valve sclerosis is present, with no evidence of aortic valve stenosis. Pulmonic Valve: The pulmonic valve was normal in structure. Pulmonic valve regurgitation is not visualized. No evidence of pulmonic stenosis. Aorta: The aortic root is normal in size and structure. Venous: The inferior vena cava is normal in size with greater than 50% respiratory variability, suggesting right atrial pressure of 3 mmHg. IAS/Shunts: No atrial level shunt detected by color flow Doppler. Additional Comments: A device lead is visualized in the right ventricle.  LEFT VENTRICLE PLAX 2D LVIDd:         3.80 cm  Diastology LVIDs:  2.70 cm  LV e' medial:    5.33 cm/s LV PW:         1.00 cm  LV E/e' medial:  17.9 LV IVS:        1.40 cm  LV e' lateral:   8.38 cm/s LVOT diam:     1.80 cm  LV E/e' lateral: 11.4 LV SV:         56 LV SV Index:   29 LVOT Area:     2.54 cm  RIGHT VENTRICLE RV S prime:     9.36 cm/s TAPSE (M-mode): 1.7 cm LEFT ATRIUM             Index       RIGHT ATRIUM           Index LA diam:        2.40 cm 1.24 cm/m  RA Area:     19.10 cm  LA Vol (A2C):   44.8 ml 23.19 ml/m RA Volume:   51.10 ml  26.45 ml/m LA Vol (A4C):   28.5 ml 14.75 ml/m LA Biplane Vol: 38.4 ml 19.87 ml/m  AORTIC VALVE LVOT Vmax:   99.80 cm/s LVOT Vmean:  70.800 cm/s LVOT VTI:    0.219 m  AORTA Ao Root diam: 4.00 cm MITRAL VALVE MV Area (PHT): 2.90 cm     SHUNTS MV Decel Time: 262 msec     Systemic VTI:  0.22 m MV E velocity: 95.50 cm/s   Systemic Diam: 1.80 cm MV A velocity: 101.00 cm/s MV E/A ratio:  0.95 Mihai Croitoru MD Electronically signed by Thurmon Fair MD Signature Date/Time: 07/07/2020/9:10:05 AM    Final     Intake/Output      06/05 0701 06/06 0700   P.O. 800   Total Intake(mL/kg) 800 (10.1)   Urine (mL/kg/hr) 675 (0.6)   Total Output 675   Net +125          ROS  As noted above in the HPI  Blood pressure (!) 141/55, pulse (!) 58, temperature (!) 97.5 F (36.4 C), temperature source Oral, resp. rate 16, height 5\' 8"  (1.727 m), weight 79.5 kg, SpO2 98 %. Physical Exam Vitals and nursing note reviewed.  Constitutional:      Comments: Pleasant 87 year old male  Musculoskeletal:     Comments: Left lower extremity Posterior and stirrup short leg splint is intact Patient has significant muscular atrophy of his lower extremities Did not remove the splint No pain out of proportion with passive stretching Swelling is well controlled No changes in motor or sensory functions + DP pulse Knee and hip are unremarkable.  No pain with axial loading or logrolling of his hip No instability at his knee  Neurological:     Mental Status: He is alert and oriented to person, place, and time.  Psychiatric:        Behavior: Behavior is cooperative.     Assessment/Plan:  87 year old male syncopal episode admitted for cardiac event with left distal tibia fracture  -fall  -Left extra-articular distal tibia fracture, intact fibula  CT scan was obtained to further characterize the fracture.  Given patient's history and activity level we feel that  this particular fracture is amenable to nonoperative treatment.  We would like to convert him to a short leg cast prior to his departure from the hospital.  Continue with his short leg splint for now.  We will likely leave him casted for 4 weeks and possibly 6 depending on healing progress  He will be nonweightbearing on this leg for 6 to 8weeks however it sounds as if he does not use his leg for assistance for transfers anyways   Ice and elevation for swelling and pain control   Patient is in agreement with this plan  - Dispo:  Continue per cardiology  Please let us know when the patient is expected to discharge and we can convert him to a short leg cast  Follow-up with orthopedics 2 weeks from discharge   Mearl Latin, PA-C [phone] (C) 07/08/2020, 8:56 PM  [facility] [address] [place] [address] [phone] [phone] (F)    After 5pm and on the weekends please log on to Amion, go to orthopaedics and the look under the [facility] Call for the provider(s) on call. You can also call our office at [phone] and then follow the prompts to be connected to the call team.

## 2020-07-08 NOTE — Progress Notes (Signed)
Orthopedic Tech Progress Note Patient Details:  Frank Avila April 02, 1928 [id number]  Ortho Devices Type of Ortho Device: Post (short leg) splint Splint Material: Fiberglass Ortho Device/Splint Location: Left Lower Extremity Ortho Device/Splint Interventions: Ordered,Application   Post Interventions Patient Tolerated: Well Instructions Provided: Care of device,Poper ambulation with device   Gerald Stabs 07/08/2020, 3:38 PM

## 2020-07-09 ENCOUNTER — Inpatient Hospital Stay (HOSPITAL_COMMUNITY): Payer: No Typology Code available for payment source

## 2020-07-09 LAB — BASIC METABOLIC PANEL
Anion gap: 7 (ref 5–15)
BUN: 27 mg/dL — ABNORMAL HIGH (ref 8–23)
CO2: 18 mmol/L — ABNORMAL LOW (ref 22–32)
Calcium: 8.2 mg/dL — ABNORMAL LOW (ref 8.9–10.3)
Chloride: 106 mmol/L (ref 98–111)
Creatinine, Ser: 1.97 mg/dL — ABNORMAL HIGH (ref 0.61–1.24)
GFR, Estimated: 32 mL/min — ABNORMAL LOW (ref >60)
Glucose, Bld: 94 mg/dL (ref 70–99)
Potassium: 4.3 mmol/L (ref 3.5–5.1)
Sodium: 131 mmol/L — ABNORMAL LOW (ref 135–145)

## 2020-07-09 NOTE — Evaluation (Signed)
Physical Therapy Evaluation Patient Details Name: Frank Avila MRN: [id number] DOB: 11-24-1928 Today's Date: 07/09/2020   History of Present Illness  87 yo admitted 6/3 after syncope while in [facility] which pt slid out of WC getting his LLE caught. He was bradycardic with 2nd degree AV block resolved with medication. pt found to have LLE tibia fx treated non operatively. PMhx: HTN, HLD, CAD s/p CABG, spinal muscular atrophy  Clinical Impression  Pt with difficulty maintaining attention and arousal this session needing mod cues and redirection to task. Pt able to direct equipment setup to mimic home and reports he cares for himself at W/C level. Pt unable to recognize current deficits or need for assist as limiting factor in return home alone. Pt reports he just feels badly today and will move better tomorrow. PT was able to maintain NWB LLE throughout tranfers. Pt with decreased strength, transfers and function who will benefit from acute therapy to maximize mobility and safety.   134/52 (77)     Follow Up Recommendations SNF    Equipment Recommendations  None recommended by PT    Recommendations for Other Services OT consult     Precautions / Restrictions Precautions Precautions: Fall Restrictions RLE Weight Bearing: Non weight bearing LLE Weight Bearing: Non weight bearing      Mobility  Bed Mobility Overal bed mobility: Needs Assistance Bed Mobility: Supine to Sit     Supine to sit: Min assist;HOB elevated     General bed mobility comments: HOb elevated grossly 20 degrees with use of rail and exiting to left as pt reports he does at home. PT required min assist to elevate trunk from surface and increased time and cues    Transfers Overall transfer level: Needs assistance   Transfers: Lateral/Scoot Transfers          Lateral/Scoot Transfers: Min assist General transfer comment: min assist with cues for safety and assist for setup to scoot toward right from bed to  chair with drop arm recliner with assist to shift and position pillow and pad under pt as well as initial blocking and support for trunk control with posterior lean  Ambulation/Gait             General Gait Details: pt non ambulatory at baseline  Stairs            Wheelchair Mobility    Modified Rankin (Stroke Patients Only)       Balance Overall balance assessment: Needs assistance   Sitting balance-Leahy Scale: Poor   Postural control: Posterior lean                                   Pertinent Vitals/Pain Pain Assessment: 0-10 Pain Score: 4  Pain Location: LLE with repositioning Pain Descriptors / Indicators: Guarding Pain Intervention(s): Limited activity within patient's tolerance;Monitored during session;Repositioned    Home Living Family/patient expects to be discharged to:: Private residence Living Arrangements: Alone Available Help at Discharge: Family;Available PRN/intermittently Type of Home: House Home Access: Ramped entrance     Home Layout: One level Home Equipment: Wheelchair - power;Wheelchair - manual;Tub bench;Grab bars - toilet      Prior Function Level of Independence: Independent with assistive device(s)         Comments: pt reports he normally transfers to power chair and performs all his own self care and cooking     Hand Dominance  Extremity/Trunk Assessment   Upper Extremity Assessment Upper Extremity Assessment: Generalized weakness    Lower Extremity Assessment Lower Extremity Assessment: LLE deficits/detail;RLE deficits/detail RLE Deficits / Details: grossly 3/5 but did not formally assess LLE Deficits / Details: pt with atrophy without AROM throughout session with assist to move with splint present       Communication   Communication: No difficulties  Cognition Arousal/Alertness: Lethargic Behavior During Therapy: Flat affect Overall Cognitive Status: Impaired/Different from  baseline Area of Impairment: Safety/judgement;Attention                   Current Attention Level: Focused     Safety/Judgement: Decreased awareness of safety;Decreased awareness of deficits     General Comments: pt with difficulty maintaining attention today drifting to sleep with mod cues for arousal. Pt required assist for all mobility but failed to recognize how this is a negative implication for living alone      General Comments      Exercises     Assessment/Plan    PT Assessment Patient needs continued PT services  PT Problem List Decreased strength;Decreased mobility;Decreased safety awareness;Decreased activity tolerance;Decreased balance;Decreased knowledge of use of DME       PT Treatment Interventions Therapeutic exercise;DME instruction;Functional mobility training;Therapeutic activities;Patient/family education;Cognitive remediation;Balance training    PT Goals (Current goals can be found in the Care Plan section)  Acute Rehab PT Goals Patient Stated Goal: return home PT Goal Formulation: With patient Time For Goal Achievement: 07/23/20 Potential to Achieve Goals: Fair    Frequency Min 3X/week   Barriers to discharge Decreased caregiver support      Co-evaluation               AM-PAC PT "6 Clicks" Mobility  Outcome Measure Help needed turning from your back to your side while in a flat bed without using bedrails?: A Little Help needed moving from lying on your back to sitting on the side of a flat bed without using bedrails?: A Little Help needed moving to and from a bed to a chair (including a wheelchair)?: A Little Help needed standing up from a chair using your arms (e.g., wheelchair or bedside chair)?: Total Help needed to walk in hospital room?: Total Help needed climbing 3-5 steps with a railing? : Total 6 Click Score: 12    End of Session   Activity Tolerance: Patient tolerated treatment well Patient left: in chair;with call  bell/phone within reach;with chair alarm set;with nursing/sitter in room Nurse Communication: Mobility status;Other (comment) (sequence for lateral scoot transfer) PT Visit Diagnosis: Other abnormalities of gait and mobility (R26.89);Muscle weakness (generalized) (M62.81)    Time: 1100-1118 PT Time Calculation (min) (ACUTE ONLY): 18 min   Charges:   PT Evaluation $PT Eval Moderate Complexity: 1 Mod          Nakiea Metzner P, PT Acute Rehabilitation Services Pager: [phone] Office: [phone]   Teriah Muela B Fynn Adel 07/09/2020, 1:10 PM

## 2020-07-09 NOTE — Progress Notes (Signed)
Progress Note  Patient Name: ANIELLO CHRISTOPOULOS Date of Encounter: 07/09/2020  [facility] Cardiologist: Croitoru ,  [facility])   Subjective   87 yo with  Fa;; and distal left tibial spiral fracture  Hx of HTN Found to be bradycardic ( 2nd degree AV block )  Had a temp pacer , has now been DC/d He is wheelchair bound   Has CKD  Was admitted to cardiology by the covering fellow.  Had bradycardia on admission - lkely due to propranolol therapy Is stable enough to be on progressive floor  will get PT and OT consult    Inpatient Medications    Scheduled Meds: . amLODipine  5 mg Oral q morning  . atorvastatin  20 mg Oral q morning  . Chlorhexidine Gluconate Cloth  6 each Topical Daily  . clopidogrel  75 mg Oral q morning  . donepezil  10 mg Oral BID  . furosemide  40 mg Oral Q M,W,F  . gabapentin  800 mg Oral QPC supper  . montelukast  10 mg Oral q morning  . polyethylene glycol  17 g Oral Daily  . primidone  25 mg Oral QHS  . psyllium  1 packet Oral Daily  . sodium chloride flush  3 mL Intravenous Q12H  . tamsulosin  0.4 mg Oral QHS  . vitamin B-12  1,000 mcg Oral q morning   Continuous Infusions: . sodium chloride     PRN Meds: sodium chloride, acetaminophen, melatonin, ondansetron (ZOFRAN) IV, sodium chloride flush, traMADol   Vital Signs    Vitals:   07/09/20 0500 07/09/20 0600 07/09/20 0700 07/09/20 0800  BP: (!) 121/51 (!) 128/52 (!) 130/49 (!) 135/54  Pulse: 61 67 62 62  Resp: 13 12 14 14   Temp:      TempSrc:      SpO2: 97% 93% 96% 95%  Weight:  74.6 kg    Height:        Intake/Output Summary (Last 24 hours) at 07/09/2020 0826 Last data filed at 07/09/2020 0800 Gross per 24 hour  Intake 783 ml  Output 725 ml  Net 58 ml   Last 3 Weights 07/09/2020 07/08/2020 07/06/2020  Weight (lbs) 164 lb 7.4 oz 170 lb 13.7 oz 175 lb 4.3 oz  Weight (kg) 74.6 kg 77.5 kg 79.5 kg      Telemetry    NSR , 1st degree AV block  - Personally Reviewed  ECG      - Personally Reviewed  Physical Exam   GEN: No acute distress.  Examined in bed  Neck: No JVD Cardiac: RRR, no murmurs, rubs, or gallops.  Respiratory: Clear to auscultation bilaterally. GI: Soft, nontender, non-distended  MS: significant atrophy in legs .  Neuro:  Nonfocal ,  Profound leg weakness,  Difficult to assess left leg due to fracture   Psych: Normal affect   Labs    High Sensitivity Troponin:  No results for input(s): TROPONINIHS in the last 720 hours.    Chemistry Recent Labs  Lab 07/06/20 2338 07/09/20 0245  NA 130* 131*  K 4.4 4.3  CL 102 106  CO2 19* 18*  GLUCOSE 109* 94  BUN 34* 27*  CREATININE 1.87* 1.97*  CALCIUM 8.2* 8.2*  PROT 6.3*  --   ALBUMIN 3.1*  --   AST 13*  --   ALT 13  --   ALKPHOS 63  --   BILITOT 0.9  --   GFRNONAA 34* 32*  ANIONGAP  9 7     Hematology Recent Labs  Lab 07/06/20 2338  WBC 15.2*  RBC 3.59*  HGB 11.3*  HCT 33.4*  MCV 93.0  MCH 31.5  MCHC 33.8  RDW 12.0  PLT 341    BNPNo results for input(s): BNP, PROBNP in the last 168 hours.   DDimer No results for input(s): DDIMER in the last 168 hours.   Radiology    CT ANKLE LEFT WO CONTRAST  Result Date: 07/08/2020 CLINICAL DATA:  Distal tibial fracture. EXAM: CT OF THE LEFT ANKLE WITHOUT CONTRAST TECHNIQUE: Multidetector CT imaging of the left ankle was performed according to the standard protocol. Multiplanar CT image reconstructions were also generated. COMPARISON:  Radiograph 07/08/2020 and CT scan 08/31/2017 FINDINGS: Bones/Joint/Cartilage Acute oblique fracture extending from the medial distal tibial diaphysis into the lateral distal tibial metaphysis along the margin of the proximal tibiofibular articulation. Nondisplaced transverse fracture of the distal fibula metaphysis. No definite fracture involvement of the tibial plafond and. There is some chronic deformity of the medial malleolus not appreciably changed from 08/31/2017 probably related to an old injury.  Abnormal hyperdense nodularity in the tibiotalar joint, talofibular joint, and distal tibiofibular articulation as shown on the 08/31/2017 exam, probably reflecting pigmented villonodular synovitis, and associated with some subtle erosive findings. Degenerative loss of articular space along the subtalar joint. Ligaments Suboptimally assessed by CT. Muscles and Tendons No tendon entrapment. Severe atrophy of the visualized musculature in the mid and lower calf. Soft tissues Subcutaneous edema along the distal calf and dorsally in the ankle extending into the foot. Atherosclerotic vascular calcifications. Clips along the medial calf likely from prior saphenous vein harvest. IMPRESSION: 1. Acute oblique fracture extending from the medial distal tibial diaphysis to the lateral distal tibial metaphysis. 2. Acute nondisplaced transverse fracture of the distal fibular metaphysis. 3. Chronic hyperdense nodularity in the ankle joint compatible with pigmented villonodular synovitis. 4. Severe muscular atrophy in the calf. 5. Subcutaneous edema in the distal calf, ankle, and dorsal foot. 6. Atherosclerosis. Electronically Signed   By: Gaylyn Rong M.D.   On: 07/08/2020 15:25   DG Ankle Left Port  Result Date: 07/08/2020 CLINICAL DATA:  Pain and swelling EXAM: PORTABLE LEFT ANKLE - 2 VIEW COMPARISON:  None. FINDINGS: Frontal and lateral views were obtained. Underlying osteoporosis. There is a spiral fracture of the distal tibial diaphysis extending to the level of the diaphysis-metaphysis junction. Alignment near anatomic. No other fracture. There is a joint effusion. There is moderate generalized joint space narrowing. No erosion. Ankle mortise appears intact. There are multiple foci of arterial vascular calcification. IMPRESSION: Osteoporosis. Spiral fracture distal tibia with alignment near anatomic. No other fracture. There is a joint effusion which potentially could indicate superimposed ligamentous injury.  Generalized joint space narrowing. Multiple foci of arterial vascular calcification noted. These results will be called to the ordering clinician or representative by the Radiologist Assistant, and communication documented in the PACS or [employer]. Electronically Signed   By: Bretta Bang III M.D.   On: 07/08/2020 12:11   ECHOCARDIOGRAM COMPLETE  Result Date: 07/07/2020    ECHOCARDIOGRAM REPORT   Patient Name:   TAISEI BONNETTE Date of Exam: 07/07/2020 Medical Rec #:  [id number]        Height:       68.0 in Accession #:    [id number]       Weight:       175.3 lb Date of Birth:  [birth year]  BSA:          1.932 m Patient Age:    89 years         BP:           121/56 mmHg Patient Gender: M                HR:           58 bpm. Exam Location:  Inpatient Procedure: 2D Echo, Color Doppler and Cardiac Doppler Indications:    2nd Degree Heart Block i44.1  History:        Patient has prior history of Echocardiogram examinations, most                 recent 04/07/2016. CAD. [facility]  Sonographer:    Irving BurtonEmily Senior RDCS Referring Phys: [id number] KELLY E ARPS IMPRESSIONS  1. Left ventricular ejection fraction, by estimation, is 60 to 65%. The left ventricle has normal function. The left ventricle has no regional wall motion abnormalities. Left ventricular diastolic parameters are consistent with Grade II diastolic dysfunction (pseudonormalization). Elevated left atrial pressure.  2. Right ventricular systolic function is normal. The right ventricular size is normal.  3. The mitral valve is normal in structure. No evidence of mitral valve regurgitation. No evidence of mitral stenosis.  4. The aortic valve is normal in structure. Aortic valve regurgitation is not visualized. Mild aortic valve sclerosis is present, with no evidence of aortic valve stenosis.  5. The inferior vena cava is normal in size with greater than 50% respiratory variability, suggesting right atrial pressure of 3 mmHg. FINDINGS   Left Ventricle: Left ventricular ejection fraction, by estimation, is 60 to 65%. The left ventricle has normal function. The left ventricle has no regional wall motion abnormalities. The left ventricular internal cavity size was normal in size. There is  no left ventricular hypertrophy. Abnormal (paradoxical) septal motion, consistent with RV pacemaker. Left ventricular diastolic parameters are consistent with Grade II diastolic dysfunction (pseudonormalization). Elevated left atrial pressure. Right Ventricle: The right ventricular size is normal. No increase in right ventricular wall thickness. Right ventricular systolic function is normal. Left Atrium: Left atrial size was normal in size. Right Atrium: Right atrial size was normal in size. Pericardium: There is no evidence of pericardial effusion. Mitral Valve: The mitral valve is normal in structure. Mild to moderate mitral annular calcification. No evidence of mitral valve regurgitation. No evidence of mitral valve stenosis. Tricuspid Valve: The tricuspid valve is normal in structure. Tricuspid valve regurgitation is not demonstrated. No evidence of tricuspid stenosis. Aortic Valve: The aortic valve is normal in structure. Aortic valve regurgitation is not visualized. Mild aortic valve sclerosis is present, with no evidence of aortic valve stenosis. Pulmonic Valve: The pulmonic valve was normal in structure. Pulmonic valve regurgitation is not visualized. No evidence of pulmonic stenosis. Aorta: The aortic root is normal in size and structure. Venous: The inferior vena cava is normal in size with greater than 50% respiratory variability, suggesting right atrial pressure of 3 mmHg. IAS/Shunts: No atrial level shunt detected by color flow Doppler. Additional Comments: A device lead is visualized in the right ventricle.  LEFT VENTRICLE PLAX 2D LVIDd:         3.80 cm  Diastology LVIDs:         2.70 cm  LV e' medial:    5.33 cm/s LV PW:         1.00 cm  LV E/e'  medial:  17.9 LV IVS:  1.40 cm  LV e' lateral:   8.38 cm/s LVOT diam:     1.80 cm  LV E/e' lateral: 11.4 LV SV:         56 LV SV Index:   29 LVOT Area:     2.54 cm  RIGHT VENTRICLE RV S prime:     9.36 cm/s TAPSE (M-mode): 1.7 cm LEFT ATRIUM             Index       RIGHT ATRIUM           Index LA diam:        2.40 cm 1.24 cm/m  RA Area:     19.10 cm LA Vol (A2C):   44.8 ml 23.19 ml/m RA Volume:   51.10 ml  26.45 ml/m LA Vol (A4C):   28.5 ml 14.75 ml/m LA Biplane Vol: 38.4 ml 19.87 ml/m  AORTIC VALVE LVOT Vmax:   99.80 cm/s LVOT Vmean:  70.800 cm/s LVOT VTI:    0.219 m  AORTA Ao Root diam: 4.00 cm MITRAL VALVE MV Area (PHT): 2.90 cm     SHUNTS MV Decel Time: 262 msec     Systemic VTI:  0.22 m MV E velocity: 95.50 cm/s   Systemic Diam: 1.80 cm MV A velocity: 101.00 cm/s MV E/A ratio:  0.95 Mihai Croitoru MD Electronically signed by Thurmon Fair MD Signature Date/Time: 07/07/2020/9:10:05 AM    Final     Cardiac Studies     Patient Profile     87 y.o. male with HTN, tremor.  Admitted after a mechanical fall.   His left leg got twisted up in his wheelchair while he was trying to transfer. Larey Seat and broke his left ankle ( spiral fracture 0 Was profoundly bradycardic This bradycardia has resolved with discontinueation of his propranolol   Assessment & Plan    1.  Bradycardia:   Resolved now that he is off propranolol Temp pacer is out. No indication for pacer at this time  2.  Spiral fracture of left tibia Further plans per ortho Needs PT and OT evaluation  He will likely need SNF or CIR   3.  HTN:  Stable for now  4  Tremor .  Stable   Transfer to progressive      For questions or updates, please contact [facility] Please consult [email or web address] for contact info under        Signed, Kristeen Miss, MD  07/09/2020, 8:26 AM

## 2020-07-09 NOTE — Progress Notes (Signed)
   Spoke with Dr. Natale Milch with [facility]. Medicine to assume care tomorrow for ongoing management of his tibial fracture. Cardiology will continue to follow though issues with bradycardia have resolved.   Beatriz Stallion, PA-C 07/09/20; 11:23 AM

## 2020-07-10 ENCOUNTER — Encounter (HOSPITAL_COMMUNITY): Payer: Self-pay | Admitting: Cardiology

## 2020-07-10 DIAGNOSIS — T148XXA Other injury of unspecified body region, initial encounter: Secondary | ICD-10-CM

## 2020-07-10 DIAGNOSIS — D72829 Elevated white blood cell count, unspecified: Secondary | ICD-10-CM

## 2020-07-10 LAB — BASIC METABOLIC PANEL
Anion gap: 11 (ref 5–15)
BUN: 25 mg/dL — ABNORMAL HIGH (ref 8–23)
CO2: 18 mmol/L — ABNORMAL LOW (ref 22–32)
Calcium: 8.6 mg/dL — ABNORMAL LOW (ref 8.9–10.3)
Chloride: 103 mmol/L (ref 98–111)
Creatinine, Ser: 1.93 mg/dL — ABNORMAL HIGH (ref 0.61–1.24)
GFR, Estimated: 32 mL/min — ABNORMAL LOW (ref >60)
Glucose, Bld: 109 mg/dL — ABNORMAL HIGH (ref 70–99)
Potassium: 4 mmol/L (ref 3.5–5.1)
Sodium: 132 mmol/L — ABNORMAL LOW (ref 135–145)

## 2020-07-10 LAB — URINALYSIS, ROUTINE W REFLEX MICROSCOPIC
Bilirubin Urine: NEGATIVE
Glucose, UA: NEGATIVE mg/dL
Hgb urine dipstick: NEGATIVE
Ketones, ur: NEGATIVE mg/dL
Nitrite: POSITIVE — AB
Protein, ur: 100 mg/dL — AB
Specific Gravity, Urine: 1.012 (ref 1.005–1.030)
WBC, UA: >50 WBC/hpf — ABNORMAL HIGH (ref 0–5)
pH: 6 (ref 5.0–8.0)

## 2020-07-10 LAB — CBC
HCT: 30.4 % — ABNORMAL LOW (ref 39.0–52.0)
Hemoglobin: 10.3 g/dL — ABNORMAL LOW (ref 13.0–17.0)
MCH: 32.1 pg (ref 26.0–34.0)
MCHC: 33.9 g/dL (ref 30.0–36.0)
MCV: 94.7 fL (ref 80.0–100.0)
Platelets: 289 10*3/uL (ref 150–400)
RBC: 3.21 MIL/uL — ABNORMAL LOW (ref 4.22–5.81)
RDW: 12 % (ref 11.5–15.5)
WBC: 11 10*3/uL — ABNORMAL HIGH (ref 4.0–10.5)
nRBC: 0 % (ref 0.0–0.2)

## 2020-07-10 NOTE — Progress Notes (Signed)
    Appreciate Dr. Burna Sis assistance in the care of this patient His bradycardia has resolved with DC of propranolol HR this am is 85.  No cardiac issues.  [facility] will sign off.   Medication Recommendations:  Continue current meds.  Holding beta blockers  Other recommendations (labs, testing, etc):   Follow up as an outpatient:  By primary MD  Call for questions.    Kristeen Miss, MD  07/10/2020 10:47 AM    [facility] [address] [place],  [address] [place], [place]  [address] Phone: [phone]; Fax: [phone]

## 2020-07-10 NOTE — Consult Note (Addendum)
Medical Consultation   Frank Avila  QQI:[id number]  DOB: [birth year]  DOA: 07/06/2020  PCP: No primary care provider on file.      Requesting physician: Dr. Elease Hashimoto  Reason for consultation: Assume care   History of Present Illness: Frank Avila is an 87 y.o. male with history of coronary artery disease status post CABG in 1993.  Who initially was admitted by cardiology on 6/4 for evaluation of syncope.  On arrival to the [facility] health emergency department in [facility] he was noted to be allergic, bradycardic and hypotensive.  He was noted to be in bigeminy with a palpable pulse rates of 20 to 30 bpm.  He received 0.5 mg of atropine with transient increase in heart rate.  A transvenous pacing wire was placed emergently.  His mental status improved. Currently further history was obtained by cardiology and he did not syncopized but had mechanical fall after his leg got twisted in his wheelchair while he was trying to transfer.  He was found to have a tib/fib fx and ortho was consulted.  Patient's bradycardia resolved with discontinuation of propanolol.  [facility] was asked to assume care on 6/6.  Developed fever and chills   Review of Systems:   As per HPI otherwise 10 point review of systems negative.   Past Medical History: Past Medical History:  Diagnosis Date  . Bradycardia   . CAD (coronary artery disease)   . Dementia (HCC)   . GERD (gastroesophageal reflux disease)   . HLD (hyperlipidemia)   . HTN (hypertension)     Past Surgical History: Patient unable to provide due to dementia  Allergies:   Allergies  Allergen Reactions  . Codeine Other (See Comments)    Unknown reaction  . Penicillins Other (See Comments)    Unknown reaction     Social History:  has no history on file for tobacco use, alcohol use, and drug use.   Family History: No family history on file.- patient unable to provide due to dementia   Physical  Exam: Vitals:   07/10/20 0500 07/10/20 0600 07/10/20 0730 07/10/20 0934  BP: (!) 144/57 (!) 146/51  (!) 137/56  Pulse: 75 74    Resp: 16 17    Temp:   (!) 100.5 F (38.1 C)   TempSrc:   Oral   SpO2: 95% 96%    Weight:      Height:        Constitutional: elderly, not in any acute distress. CVS: no LE edema, RRR Respiratory: no wheezing, diminished.  Abdomen: soft nontender, nondistended, normal bowel sounds, no hepatosplenomegaly, no hernias  Musculoskeletal: left leg in brace, no swelling/redness above brace Psych: judgement and insight appear normal, stable mood and affect, mental status Skin: no rashes or lesions or ulcers, no induration or nodules     Data reviewed:  I have personally reviewed following labs and imaging studies Labs:  CBC: Recent Labs  Lab 07/06/20 2338 07/10/20 0802  WBC 15.2* 11.0*  NEUTROABS 13.7*  --   HGB 11.3* 10.3*  HCT 33.4* 30.4*  MCV 93.0 94.7  PLT 341 289    Basic Metabolic Panel: Recent Labs  Lab 07/06/20 2338 07/09/20 0245 07/10/20 0802  NA 130* 131* 132*  K 4.4 4.3 4.0  CL 102 106 103  CO2 19* 18* 18*  GLUCOSE 109* 94 109*  BUN 34* 27* 25*  CREATININE 1.87* 1.97* 1.93*  CALCIUM 8.2* 8.2* 8.6*  MG 2.1  --   --    GFR Estimated Creatinine Clearance: 24.6 mL/min (A) (by C-G formula based on SCr of 1.93 mg/dL (H)). Liver Function Tests: Recent Labs  Lab 07/06/20 2338  AST 13*  ALT 13  ALKPHOS 63  BILITOT 0.9  PROT 6.3*  ALBUMIN 3.1*   No results for input(s): LIPASE, AMYLASE in the last 168 hours. No results for input(s): AMMONIA in the last 168 hours. Coagulation profile Recent Labs  Lab 07/06/20 2338  INR 1.1    Cardiac Enzymes: No results for input(s): CKTOTAL, CKMB, CKMBINDEX, TROPONINI in the last 168 hours. BNP: Invalid input(s): POCBNP CBG: No results for input(s): GLUCAP in the last 168 hours. D-Dimer No results for input(s): DDIMER in the last 72 hours. Hgb A1c No results for input(s): HGBA1C  in the last 72 hours. Lipid Profile No results for input(s): CHOL, HDL, LDLCALC, TRIG, CHOLHDL, LDLDIRECT in the last 72 hours. Thyroid function studies No results for input(s): TSH, T4TOTAL, T3FREE, THYROIDAB in the last 72 hours.  Invalid input(s): FREET3 Anemia work up No results for input(s): VITAMINB12, FOLATE, FERRITIN, TIBC, IRON, RETICCTPCT in the last 72 hours. Urinalysis    Component Value Date/Time   COLORURINE YELLOW 07/07/2020 0604   APPEARANCEUR HAZY (A) 07/07/2020 0604   LABSPEC 1.006 07/07/2020 0604   PHURINE 5.0 07/07/2020 0604   GLUCOSEU NEGATIVE 07/07/2020 0604   HGBUR NEGATIVE 07/07/2020 0604   BILIRUBINUR NEGATIVE 07/07/2020 0604   KETONESUR NEGATIVE 07/07/2020 0604   PROTEINUR 100 (A) 07/07/2020 0604   NITRITE NEGATIVE 07/07/2020 0604   LEUKOCYTESUR SMALL (A) 07/07/2020 0604     Sepsis Labs Invalid input(s): PROCALCITONIN,  WBC,  LACTICIDVEN Microbiology Recent Results (from the past 240 hour(s))  MRSA PCR Screening     Status: None   Collection Time: 07/07/20  6:09 AM   Specimen: Urine, Clean Catch; Nasopharyngeal  Result Value Ref Range Status   MRSA by PCR NEGATIVE NEGATIVE Final    Comment:        The GeneXpert MRSA Assay (FDA approved for NASAL specimens only), is one component of a comprehensive MRSA colonization surveillance program. It is not intended to diagnose MRSA infection nor to guide or monitor treatment for MRSA infections. Performed at [facility], [address] [place], [place] [address]        Inpatient Medications:   Scheduled Meds: . amLODipine  5 mg Oral q morning  . atorvastatin  20 mg Oral q morning  . Chlorhexidine Gluconate Cloth  6 each Topical Daily  . clopidogrel  75 mg Oral q morning  . donepezil  10 mg Oral BID  . furosemide  40 mg Oral Q M,W,F  . gabapentin  800 mg Oral QPC supper  . montelukast  10 mg Oral q morning  . polyethylene glycol  17 g Oral Daily  . primidone  25 mg Oral QHS  .  psyllium  1 packet Oral Daily  . sodium chloride flush  3 mL Intravenous Q12H  . tamsulosin  0.4 mg Oral QHS  . vitamin B-12  1,000 mcg Oral q morning   Continuous Infusions: . sodium chloride       Radiological Exams on Admission: DG Ankle Complete Left  Result Date: 07/09/2020 CLINICAL DATA:  Left ankle fracture EXAM: LEFT ANKLE COMPLETE - 3+ VIEW COMPARISON:  CT and radiograph 07/08/2020 FINDINGS: Unchanged alignment of the minimally displaced distal tibia and fibular fractures in splint. Diffuse osteopenia.  Unchanged dense joint distension of the tibiotalar joint. IMPRESSION: Unchanged alignment of the minimally displaced distal tibia and fibular fractures in splint. Diffuse osteopenia. Electronically Signed   By: Caprice Renshaw   On: 07/09/2020 09:41   CT ANKLE LEFT WO CONTRAST  Result Date: 07/08/2020 CLINICAL DATA:  Distal tibial fracture. EXAM: CT OF THE LEFT ANKLE WITHOUT CONTRAST TECHNIQUE: Multidetector CT imaging of the left ankle was performed according to the standard protocol. Multiplanar CT image reconstructions were also generated. COMPARISON:  Radiograph 07/08/2020 and CT scan 08/31/2017 FINDINGS: Bones/Joint/Cartilage Acute oblique fracture extending from the medial distal tibial diaphysis into the lateral distal tibial metaphysis along the margin of the proximal tibiofibular articulation. Nondisplaced transverse fracture of the distal fibula metaphysis. No definite fracture involvement of the tibial plafond and. There is some chronic deformity of the medial malleolus not appreciably changed from 08/31/2017 probably related to an old injury. Abnormal hyperdense nodularity in the tibiotalar joint, talofibular joint, and distal tibiofibular articulation as shown on the 08/31/2017 exam, probably reflecting pigmented villonodular synovitis, and associated with some subtle erosive findings. Degenerative loss of articular space along the subtalar joint. Ligaments Suboptimally assessed by CT.  Muscles and Tendons No tendon entrapment. Severe atrophy of the visualized musculature in the mid and lower calf. Soft tissues Subcutaneous edema along the distal calf and dorsally in the ankle extending into the foot. Atherosclerotic vascular calcifications. Clips along the medial calf likely from prior saphenous vein harvest. IMPRESSION: 1. Acute oblique fracture extending from the medial distal tibial diaphysis to the lateral distal tibial metaphysis. 2. Acute nondisplaced transverse fracture of the distal fibular metaphysis. 3. Chronic hyperdense nodularity in the ankle joint compatible with pigmented villonodular synovitis. 4. Severe muscular atrophy in the calf. 5. Subcutaneous edema in the distal calf, ankle, and dorsal foot. 6. Atherosclerosis. Electronically Signed   By: Gaylyn Rong M.D.   On: 07/08/2020 15:25   DG Ankle Left Port  Result Date: 07/08/2020 CLINICAL DATA:  Pain and swelling EXAM: PORTABLE LEFT ANKLE - 2 VIEW COMPARISON:  None. FINDINGS: Frontal and lateral views were obtained. Underlying osteoporosis. There is a spiral fracture of the distal tibial diaphysis extending to the level of the diaphysis-metaphysis junction. Alignment near anatomic. No other fracture. There is a joint effusion. There is moderate generalized joint space narrowing. No erosion. Ankle mortise appears intact. There are multiple foci of arterial vascular calcification. IMPRESSION: Osteoporosis. Spiral fracture distal tibia with alignment near anatomic. No other fracture. There is a joint effusion which potentially could indicate superimposed ligamentous injury. Generalized joint space narrowing. Multiple foci of arterial vascular calcification noted. These results will be called to the ordering clinician or representative by the Radiologist Assistant, and communication documented in the PACS or [employer]. Electronically Signed   By: Bretta Bang III M.D.   On: 07/08/2020 12:11     Impression/Recommendations Active Problems:   Symptomatic bradycardia   CAD (coronary artery disease) of artery bypass graft   Tremor   Leukocytosis   Fever with elevated WBCs -had temp pacemaker so will check blood cultures -U/A pending but admission U/A unremarkable -can get dg chest but no cough -no sign of blood clot in LE  bradycardia -resolved on propanolol -being seen by cardiology  Tib/fib fracture -ortho consult: Please let us know when the patient is expected to discharge and we can convert him to a short leg cast.    We will likely leave him casted for 4 weeks and possibly 6 depending on healing progress.  Follow-up with orthopedics 2 weeks from discharge -PT/OT (wheelchair bound per history)  [facility] will assume care as per agreement with Dr. Natale Milch.   Time Spent: 65 min  Joseph Art DO [facility] 07/10/2020, 10:25 AM

## 2020-07-10 NOTE — TOC Initial Note (Signed)
Transition of Care [facility]) - Initial/Assessment Note    Patient Details  Name: Frank Avila MRN: [id number] Date of Birth: 1928-12-07  Transition of Care [facility]) CM/SW Contact:    Terrial Rhodes, LCSWA Phone Number: 07/10/2020, 11:14 AM  Clinical Narrative:                  CSW received consult for possible SNF placement at time of discharge. CSW spoke with patient at bedside regarding PT recommendation of SNF placement at time of discharge. Patient comes from home alone. Patient is veteran with [facility]. Patient expressed understanding of PT recommendation and is agreeable to SNF placement at time of discharge. Patient reports preference for [facility] . Patient gave CSW permission to fax out initial referral near the [place] area. Patient has received the COVID vaccines as well as booster.Patient gave CSW permission to discuss his care with his niece [name].  No further questions reported at this time. CSW to continue to follow and assist with discharge planning needs.  Expected Discharge Plan: Skilled Nursing Facility Barriers to Discharge: Continued Medical Work up   Patient Goals and CMS Choice Patient states their goals for this hospitalization and ongoing recovery are:: SNF CMS Medicare.gov Compare Post Acute Care list provided to:: Patient Choice offered to / list presented to : Patient  Expected Discharge Plan and Services Expected Discharge Plan: Skilled Nursing Facility In-house Referral: Clinical Social Work     Living arrangements for the past 2 months: Single Family Home                                      Prior Living Arrangements/Services Living arrangements for the past 2 months: Single Family Home Lives with:: Self Patient language and need for interpreter reviewed:: Yes Do you feel safe going back to the place where you live?: No   SNF  Need for Family Participation in Patient Care: Yes (Comment) Care giver support system in place?: Yes  (comment)   Criminal Activity/Legal Involvement Pertinent to Current Situation/Hospitalization: No - Comment as needed  Activities of Daily Living Home Assistive Devices/Equipment: Wheelchair ADL Screening (condition at time of admission) Patient's cognitive ability adequate to safely complete daily activities?: Yes Is the patient deaf or have difficulty hearing?: Yes Does the patient have difficulty seeing, even when wearing glasses/contacts?: No Does the patient have difficulty concentrating, remembering, or making decisions?: No Patient able to express need for assistance with ADLs?: No Does the patient have difficulty dressing or bathing?: No Independently performs ADLs?: Yes (appropriate for developmental age) Does the patient have difficulty walking or climbing stairs?: Yes Weakness of Legs: Both Weakness of Arms/Hands: None  Permission Sought/Granted Permission sought to share information with : Case Manager,Family Electrical engineer Permission granted to share information with : Yes, Verbal Permission Granted  Share Information with NAME: [name]  Permission granted to share info w AGENCY: SNF  Permission granted to share info w Relationship: Niece  Permission granted to share info w Contact Information: [name] [phone]  Emotional Assessment Appearance:: Appears stated age Attitude/Demeanor/Rapport: Gracious Affect (typically observed): Calm Orientation: : Oriented to Self,Oriented to Place,Oriented to  Time,Oriented to Situation Alcohol / Substance Use: Not Applicable Psych Involvement: No (comment)  Admission diagnosis:  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 CAD (coronary artery disease) of artery bypass graft 07/07/2020  . Tremor  07/07/2020  . Leukocytosis 07/07/2020  . Symptomatic bradycardia 07/06/2020   PCP:  No primary care provider on file. Pharmacy:   [facility] [phone] -  [place], [place] - [address] AT [facility] [place] [address] Phone: [phone] Fax: [phone]  [facility] - [place], [place] - [address] [place] [address] Phone: [phone] Fax: [phone]  [facility] - [place], [place] - [address] [place] [address] Phone: [phone] Fax: [phone]     Social Determinants of Health (SDOH) Interventions    Readmission Risk Interventions No flowsheet data found.

## 2020-07-10 NOTE — Evaluation (Signed)
Occupational Therapy Evaluation Patient Details Name: Frank Avila MRN: [id number] DOB: 12-11-1928 Today's Date: 07/10/2020    History of Present Illness 87 yo admitted 6/3 after syncope while in [facility] which pt slid out of WC getting his LLE caught. He was bradycardic with 2nd degree AV block resolved with medication. pt found to have LLE tibia fx treated non operatively. PMhx: HTN, HLD, CAD s/p CABG, spinal muscular atrophy   Clinical Impression   PTA, pt lives alone and reports Modified Independence with all ADLs/IADLs using manual wheelchair in the home and power wheelchair in the community. Pt performs scoot transfers independently at home. Pt drives van with hand controls and does his own grocery shopping. Pt presents now with deficits in strength, endurance, coordination, sitting balance requiring increased assist for ADLs. Pt overall Max A for bed mobility and able to demo scooting along bedside initially but once approaching recliner chair, required Max A x 2 for successful scoot transfer to prevent fall. Pt requires Setup A for UB ADLs and up to Total A for LB ADLs due to deficits. Plan to educate in UE HEP to address strength for transfers, as well as coordination due to difficulty holding objects. Will progress OOB ADLs as appropriate. Recommend SNF for short term rehab prior to DC home.     Follow Up Recommendations  SNF;Supervision/Assistance - 24 hour    Equipment Recommendations  None recommended by OT (appears well equipped)    Recommendations for Other Services       Precautions / Restrictions Precautions Precautions: Fall Splint/Cast - Date Prophylactic Dressing Applied (if applicable): 07/08/20 Restrictions Weight Bearing Restrictions: Yes RLE Weight Bearing: Non weight bearing LLE Weight Bearing: Non weight bearing      Mobility Bed Mobility Overal bed mobility: Needs Assistance Bed Mobility: Supine to Sit     Supine to sit: Max assist;HOB elevated      General bed mobility comments: Assist to advance B LE and heavy assist to get trunk upright    Transfers Overall transfer level: Needs assistance   Transfers: Lateral/Scoot Transfers          Lateral/Scoot Transfers: Max assist;+2 physical assistance;+2 safety/equipment;From elevated surface General transfer comment: Pt initially able to scoot along EOB towards drop arm recliner but noted to scoot forward rather than back towards recliner (requiring cues and physical assist) then pt began to fatigue and required Max A x 2 with assist from RN to assist in safely scooting pt back in chair    Balance Overall balance assessment: Needs assistance   Sitting balance-Leahy Scale: Poor                                     ADL either performed or assessed with clinical judgement   ADL Overall ADL's : Needs assistance/impaired Eating/Feeding: Set up;Sitting Eating/Feeding Details (indicate cue type and reason): reports some difficulty using regular utensils. discussed built up foam grips Grooming: Set up;Sitting   Upper Body Bathing: Set up;Sitting   Lower Body Bathing: Maximal assistance;Sitting/lateral leans;Bed level   Upper Body Dressing : Set up;Sitting   Lower Body Dressing: Maximal assistance;Sitting/lateral leans;Bed level       Toileting- Clothing Manipulation and Hygiene: Total assistance;Bed level         General ADL Comments: Pt limited by decreased endurance, decreased UB strength with chronic use of wheelchair at baseline. Pt requires increased assist for scoot transfers in comparison to  baseline     Vision Patient Visual Report: No change from baseline Vision Assessment?: No apparent visual deficits     Perception     Praxis      Pertinent Vitals/Pain Pain Assessment: Faces Faces Pain Scale: Hurts little more Pain Location: R LE with WB Pain Descriptors / Indicators: Discomfort Pain Intervention(s): Monitored during session;Limited  activity within patient's tolerance;Repositioned     Hand Dominance Right   Extremity/Trunk Assessment Upper Extremity Assessment Upper Extremity Assessment: Generalized weakness   Lower Extremity Assessment Lower Extremity Assessment: Defer to PT evaluation   Cervical / Trunk Assessment Cervical / Trunk Assessment: Kyphotic   Communication Communication Communication: No difficulties   Cognition Arousal/Alertness: Awake/alert Behavior During Therapy: WFL for tasks assessed/performed Overall Cognitive Status: Impaired/Different from baseline Area of Impairment: Safety/judgement                         Safety/Judgement: Decreased awareness of safety;Decreased awareness of deficits     General Comments: Pt required assist for all mobility but failed to recognize how this is a negative implication for living alone. pt requesting therapist to give room for pt to transfer but required physical assist to prevent fall   General Comments  BP stable lying and sitting    Exercises     Shoulder Instructions      Home Living Family/patient expects to be discharged to:: Private residence Living Arrangements: Alone Available Help at Discharge: Family;Available PRN/intermittently Type of Home: House Home Access: Ramped entrance     Home Layout: One level     Bathroom Shower/Tub: Chief Strategy Officer: Handicapped height Bathroom Accessibility: Yes How Accessible: Accessible via wheelchair Home Equipment: Wheelchair - power;Wheelchair - manual;Tub bench;Grab bars - toilet;Toilet riser;Hand held shower head;Hospital bed          Prior Functioning/Environment Level of Independence: Independent with assistive device(s)        Comments: Pt transfers to/from manual chair in the home via scoot transfers, uses tub bench for showering tasks, dresses self in wheelchair. Pt uses power chair outside of the home, has accessible van with hand controls and does  his own grocery shopping        OT Problem List: Decreased strength;Decreased activity tolerance;Impaired balance (sitting and/or standing);Decreased coordination;Decreased cognition;Decreased safety awareness;Decreased knowledge of precautions;Pain      OT Treatment/Interventions: Self-care/ADL training;Therapeutic exercise;Energy conservation;DME and/or AE instruction;Therapeutic activities;Patient/family education;Balance training    OT Goals(Current goals can be found in the care plan section) Acute Rehab OT Goals Patient Stated Goal: return home OT Goal Formulation: With patient Time For Goal Achievement: 07/24/20 Potential to Achieve Goals: Good ADL Goals Pt Will Perform Eating: with modified independence;with adaptive utensils;sitting Pt Will Perform Grooming: with modified independence;sitting Pt Will Perform Lower Body Dressing: with min assist;sitting/lateral leans Pt Will Transfer to Toilet: with mod assist Pt/caregiver will Perform Home Exercise Program: Increased strength;Both right and left upper extremity;With theraband;With theraputty;Independently;With written HEP provided  OT Frequency: Min 2X/week   Barriers to D/C:            Co-evaluation              AM-PAC OT "6 Clicks" Daily Activity     Outcome Measure Help from another person eating meals?: A Little Help from another person taking care of personal grooming?: A Little Help from another person toileting, which includes using toliet, bedpan, or urinal?: Total Help from another person bathing (including washing, rinsing, drying)?: A  Lot Help from another person to put on and taking off regular upper body clothing?: A Little Help from another person to put on and taking off regular lower body clothing?: A Lot 6 Click Score: 14   End of Session Equipment Utilized During Treatment: Gait belt Nurse Communication: Mobility status;Other (comment);Need for lift equipment (present during  transfer)  Activity Tolerance: Patient limited by fatigue Patient left: in chair;with call bell/phone within reach;with chair alarm set  OT Visit Diagnosis: Muscle weakness (generalized) (M62.81);History of falling (Z91.81)                Time: 9935-7017 OT Time Calculation (min): 37 min Charges:  OT General Charges $OT Visit: 1 Visit OT Evaluation $OT Eval Moderate Complexity: 1 Mod OT Treatments $Therapeutic Activity: 8-22 mins  Bradd Canary, OTR/L Acute Rehab Services Office: [phone]  Lorre Munroe 07/10/2020, 9:40 AM

## 2020-07-10 NOTE — Progress Notes (Signed)
Pt arrived from 2H, VSS, oriented to unit on telebox 22. Will continue to monitor.   Kalman Jewels, RN 07/10/2020  4:58 PM

## 2020-07-11 ENCOUNTER — Inpatient Hospital Stay (HOSPITAL_COMMUNITY): Payer: No Typology Code available for payment source

## 2020-07-11 LAB — SYNOVIAL CELL COUNT + DIFF, W/ CRYSTALS
Eosinophils-Synovial: 0 % (ref 0–1)
Lymphocytes-Synovial Fld: 1 % (ref 0–20)
Monocyte-Macrophage-Synovial Fluid: 3 % — ABNORMAL LOW (ref 50–90)
Neutrophil, Synovial: 96 % — ABNORMAL HIGH (ref 0–25)
WBC, Synovial: 12500 /mm3 — ABNORMAL HIGH (ref 0–200)

## 2020-07-11 MED ORDER — FENTANYL CITRATE (PF) 100 MCG/2ML IJ SOLN
12.5000 ug | Freq: Once | INTRAMUSCULAR | Status: AC
  Administered 2020-07-11: 12.5 ug via INTRAVENOUS
  Filled 2020-07-11: qty 2

## 2020-07-11 MED ORDER — PENTAFLUOROPROP-TETRAFLUOROETH EX AERO: INHALATION_SPRAY | CUTANEOUS | Status: AC

## 2020-07-11 MED ORDER — LIDOCAINE HCL 1 % IJ SOLN
15.0000 mL | INTRAMUSCULAR | Status: DC
  Administered 2020-07-11: 15 mL
  Filled 2020-07-11 (×2): qty 15

## 2020-07-11 MED ORDER — FENTANYL CITRATE (PF) 100 MCG/2ML IJ SOLN
25.0000 ug | Freq: Once | INTRAMUSCULAR | Status: AC
Start: 2020-07-11 — End: 2020-07-11
  Administered 2020-07-11: 25 ug via INTRAVENOUS
  Filled 2020-07-11: qty 2

## 2020-07-11 MED ORDER — BUPIVACAINE HCL 0.25 % IJ SOLN
5.0000 mL | Freq: Once | INTRAMUSCULAR | Status: DC
  Filled 2020-07-11: qty 5

## 2020-07-11 MED ORDER — SENNOSIDES-DOCUSATE SODIUM 8.6-50 MG PO TABS
1.0000 | ORAL_TABLET | Freq: Every evening | ORAL | Status: DC | PRN
  Administered 2020-07-11: 1 via ORAL
  Filled 2020-07-11: qty 1

## 2020-07-11 MED ORDER — BUPIVACAINE HCL (PF) 0.25 % IJ SOLN
10.0000 mL | Freq: Once | INTRAMUSCULAR | Status: AC
  Administered 2020-07-11: 10 mL
  Filled 2020-07-11: qty 10

## 2020-07-11 MED ORDER — SODIUM CHLORIDE 0.9 % IV SOLN
1.0000 g | INTRAVENOUS | Status: DC
  Administered 2020-07-11 – 2020-07-12 (×2): 1 g via INTRAVENOUS
  Filled 2020-07-11 (×2): qty 1

## 2020-07-11 MED ORDER — COLCHICINE 0.3 MG HALF TABLET
0.3000 mg | ORAL_TABLET | Freq: Once | ORAL | Status: AC
  Administered 2020-07-11: 0.3 mg via ORAL
  Filled 2020-07-11 (×2): qty 1

## 2020-07-11 MED ORDER — COLCHICINE 0.6 MG PO TABS
0.6000 mg | ORAL_TABLET | Freq: Once | ORAL | Status: AC
  Administered 2020-07-11: 0.6 mg via ORAL
  Filled 2020-07-11: qty 1

## 2020-07-11 MED ORDER — LIDOCAINE HCL (PF) 1 % IJ SOLN
15.0000 mL | Freq: Once | INTRAMUSCULAR | Status: AC
  Administered 2020-07-11: 15 mL
  Filled 2020-07-11: qty 15

## 2020-07-11 MED ORDER — LIDOCAINE 4 % EX CREA
TOPICAL_CREAM | Freq: Three times a day (TID) | CUTANEOUS | Status: DC | PRN
  Administered 2020-07-11: 1 via TOPICAL
  Filled 2020-07-11 (×2): qty 5

## 2020-07-11 NOTE — Progress Notes (Signed)
Orthopaedic Trauma Service Progress Note  Patient ID: Frank Avila MRN: [id number] DOB/AGE: 1928/06/21 87 y.o.  Subjective:  Severe knee pain overnight xrays ordered and exam done by overnight hospitalist team  No fractures, large effusion noted  Pt c/o R knee pain today Can not remember if he landed on that knee   Left lower leg is doing well.  Pain is well controlled.  Tolerating splint without issue  Pt on plavix Does not recall history of gout or pseudogout   Pt wheelchair dependent but does use R leg on occasion to help transfer  He is afebrile WBC count yesterday was marginally elevated Urinalysis suspicious for UTI  ROS As above  Objective:   VITALS:   Vitals:   07/11/20 0129 07/11/20 0329 07/11/20 0529 07/11/20 0816  BP:  (!) 139/58  140/62  Pulse: 72 75 79 79  Resp: 17 18 14 18   Temp:  98.6 F (37 C)  98.1 F (36.7 C)  TempSrc:  Oral  Oral  SpO2: 94% 99% 97% 98%  Weight:  74.7 kg    Height:        Estimated body mass index is 25.04 kg/m as calculated from the following:   Height as of this encounter: 5\' 8"  (1.727 m).   Weight as of this encounter: 74.7 kg.   Intake/Output      06/07 0701 06/08 0700 06/08 0701 06/09 0700   P.O.  240   IV Piggyback 100    Total Intake(mL/kg) 100 (1.3) 240 (3.2)   Urine (mL/kg/hr) 450 (0.3) 400 (1.2)   Stool     Total Output 450 400   Net -350 -160        Urine Occurrence 1 x      LABS  No results found for this or any previous visit (from the past 24 hour(s)).   PHYSICAL EXAM:   Gen: Sitting up in bed, appears to be resting comfortably Lungs: Unlabored Ext:       Left lower extremity     Splint is clean, dry and intact  Swelling is well controlled  No acute findings noted  Motor and sensory functions at baseline       right lower extremity  Moderate right knee effusion is present.  Patient resting with his knee at  about 110 degrees of flexion  I was able to range him to 30 degrees of extension in preparation for arthrocentesis he tolerated this well with only some mild pain  No erythema to the right knee.  He did have a small area of ecchymosis to the anterior knee.  No breaks in the skin  Chronic muscle atrophy noted  Motor and sensory functions at baseline  No acute findings noted to the hip or the ankle  Extremity is warm  + DP pulse  Assessment/Plan:     Active Problems:   Symptomatic bradycardia   CAD (coronary artery disease) of artery bypass graft   Tremor   Leukocytosis   Anti-infectives (From admission, onward)   Start     Dose/Rate Route Frequency Ordered Stop   07/11/20 0330  cefTRIAXone (ROCEPHIN) 1 g in sodium chloride 0.9 % 100 mL IVPB        1 g 200 mL/hr over 30 Minutes Intravenous Every 24 hours 07/11/20 0241      .  POD/HD#:   87 year old male syncopal episode admitted for cardiac event with left distal tibia fracture   -fall   -Left extra-articular distal tibia fracture, intact fibula             Continue nonoperative treatment left distal tibia fracture  Will convert to short leg cast prior to discharge  Continue with ice and elevation  Nonweightbearing left leg  -Right knee effusion with acute right knee pain  Bedside arthrocentesis will be performed  Send fluid for aerobic and anaerobic cultures, gram stain, synovial fluid analysis including cell count with differential and crystals  Will likely inject knee with local anesthetic only  Okay to weight-bear as tolerated right leg  Range of motion as tolerated  Ice as needed   - Dispo:             Continue per cardiology and medicine service             Please let us know when the patient is expected to discharge and we can convert him to a short leg cast             Follow-up with orthopedics 2 weeks from discharge   Mearl Latin, PA-C [phone] (C) 07/11/2020, 11:23 AM  [facility] [address] [place] [address] [phone] (F)    After 5pm and on the weekends please log on to Amion, go to orthopaedics and the look under the [facility] Call for the provider(s) on call. You can also call our office at [phone] and then follow the prompts to be connected to the call team.

## 2020-07-11 NOTE — Progress Notes (Signed)
Pt complaining of pain in his right knee.  The knee has been swollen since he fell at home before admission he says.  Given the patient 500 mg Tylenol and elevated the right lower extremity but pain does not decrease.  Pulses are palpable although the right knee is warm to the touch. No redness.  Informed on call physician who ordered an X-Ray of the right leg and a PRN of Lidocaine 4% cream to apply three times a day as needed for pain at knee site.  Will continue to monitor.  Harriet Masson, RN

## 2020-07-11 NOTE — Procedures (Signed)
Clinician: Mearl Latin, PA-C  Specimen: 45 cc yellow turbid synovial fluid  Complications: none  Intra-articular medications: 3 cc of 1% plain lidocaine and 2 cc of 0.5% Marcaine  Description  Utilizing sterile technique the superolateral aspect of the Right knee was prepped with alcohol swab, followed by betadine swabs x 3.  The superficial soft tissue was anesthetized with Gebauer's spray.  After adequate anesthesia of the skin was achieved, an 18 G needle on a 20 cc syringe was then advanced into the knee joint. A palpable pop was appreciated upon entrance to the knee.  45 cc of turbid yellow was aspirated from the joint, sediment was noted in the syringe.  Based on the clinical appearance of the fluid, a septic joint is a very low probability, did inject lidocaine and Marcaine into the knee joint to provide some analgesia.  Did not infiltrate any intra-articular steroids until it is confirmed that this is not a septic joint.  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 needle removed without difficulty, dressing applied. Pt tolerated procedure well.     Specimen will be sent to lab for gram stain, culture (aerobic and anaerobic), fluid cell count with differential and crystal analysis.  Pt can participate with therapies as he can tolerate. WBAT, no ROM restrictions.  Will follow up on labs to further guide long term treatment.    Mearl Latin, PA-C [phone] (C) 07/11/2020, 11:24 AM  [facility] [address] [place] [address] [phone] Collier Bullock (F)

## 2020-07-11 NOTE — Progress Notes (Signed)
Due to age and CKD, ok to reduce treatment dose of colchicine to 0.6mg  x1 the 0.3mg  x1 an hour later per Dr. Mahala Menghini.  Ulyses Southward, PharmD, BCIDP, AAHIVP, CPP Infectious Disease Pharmacist 07/11/2020 1:49 PM

## 2020-07-11 NOTE — Progress Notes (Addendum)
HOSPITAL MEDICINE OVERNIGHT EVENT NOTE    Nursing reports the patient has been experiencing sudden onset significant pain of the right knee.    Nursing reports there is no associated physical abnormality.  Considering recent fall will obtain right knee x-ray.  Patient is complaining of severe pain of the right knee despite previous administration of Tylenol.  Due to patient's intolerance to opiate-based analgesics and inability to use NSAIDs will attempt applying lidocaine cream to the right knee.  Chart reviewed, patient has been exhibiting fevers and leukocytosis.  Urinalysis performed on 6/7 suggestive of a urinary tract infection therefore will place patient on intravenous ceftriaxone until final urine cultures are back.  Marinda Elk  MD [facility]   ADDENDUM (6/8 4am)  X-ray of right knee reviewed, no evidence of fracture however there is evidence of a moderate effusion.  Patient states that lidocaine cream is not helping.  Will provide patient with 12.5 mg of IV fentanyl in the hopes to achieve pain control.  As mentioned above, nursing reports no physical abnormality with the knee and therefore an acute infection of the knee is extremely unlikely.  If patient continues to experience severe right knee pain perhaps a arthrocentesis and steroid injection may be warranted on day shift.  Deno Lunger Garrie Elenes   ADDENDUM (6/8 5:15AM)  Patient evaluated at the bedside . Patient is in distress due to pain.  Right knee effusion is present. Knee is warm but not red.  Knee is painful with both passive and active range of motion without crepitus.   I doubt patient has a septic joint but patient would likely benefit from an arthrocentesis on day shift with subsequent steroid injection and appropriate testing being sent off to rule out infection.  Will give an additional dose of Fentanyl now.  Deno Lunger Dimitri Shakespeare

## 2020-07-11 NOTE — Hospital Course (Signed)
87 year old veteran white male baseline wheelchair-bound CABG 1993, PCI 2012 followed by Dr. Vernona Rieger at [facility] Spinal muscular dystrophy HTN HLD Admit 07/06/2020 by cardiology secondary to LOC in the setting of recent addition of medication for tremors Found to be bradycardic rates in the 20s-Rx atropine by [facility] emergency room transvenous pacing wire placed Also found to have distal left tib-fib fracture  Cardiology consulted--bradycardia felt 2/2 propanolol Orthopedics consulted in addition Overnight 6/8 developed severe right knee pain   BUNs/creatinine 27/1.9-->25/1.9 CO2 18 WBC 11.0 Hemoglobin 10.3 UA large leukocytes positive nitrates and being treated for UTI Right knee x-ray = moderate effusion no fracture dislocation

## 2020-07-11 NOTE — Progress Notes (Signed)
PROGRESS NOTE   Frank Avila  OIL:[id number] DOB: [birth year] DOA: 07/06/2020 PCP: No primary care provider on file.  Brief Narrative:  87 year old veteran white male baseline wheelchair-bound CABG 1993, PCI 2012 followed by Dr. Vernona Rieger at [facility] Spinal muscular dystrophy HTN HLD Admit 07/06/2020 by cardiology secondary to LOC in the setting of recent addition of medication for tremors Found to be bradycardic rates in the 20s-Rx atropine by [facility] emergency room transvenous pacing wire placed Also found to have distal left tib-fib fracture  Cardiology consulted--bradycardia felt 2/2 propanolol Orthopedics consulted in addition Overnight 6/8 developed severe right knee pain--underwent arthrocentesis showing monosodium urate crystals   Hospital-Problem based course  Acute gout flare Rx colchicine limited Rx Follow culture although expect this is inflammatory, not infectious Beta-blocker mediated bradycardia Cardiology admitted the patient but they have signed off-transvenous pacing has been discontinued- is in sinus rhythm Do not resume beta-blocker-marked as allergy Distal left tib-fib fracture Defer to orthopedics-tramadol for moderate pain-pain seems controlled at this time UTI? Follow BC x 2, follow UC Urine analysis suggestive of cystitis with leukocytes, nitrites Given advanced age bladder scan to rule out retention Flomax 0.4 daily HTN Amlodipine 5 qam. Lasix 40 m-w-f--this is stable CABG CAD 1993 Plavix 75 daily, no BB Consider OP ARB  DVT prophylaxis: SCD Code Status: FULL Family Communication: none presently at bedside Disposition:  Status is: Inpatient  Remains inpatient appropriate because:Hemodynamically unstable, Altered mental status and Unsafe d/c plan   Dispo: The patient is from: Home              Anticipated d/c is to: Home              Patient currently is not medically stable to d/c.   Difficult to place patient Yes  Consultants:    ORtho  Procedures:   Antimicrobials:     Subjective:  Awake coherent sitting in bed No distress Some pain in L knee and R knee was tapped  Objective: Vitals:   07/11/20 0329 07/11/20 0529 07/11/20 0816 07/11/20 1100  BP: (!) 139/58  140/62   Pulse: 75 79 79 74  Resp: 18 14 18 18   Temp: 98.6 F (37 C)  98.1 F (36.7 C) 98.3 F (36.8 C)  TempSrc: Oral  Oral Oral  SpO2: 99% 97% 98% 94%  Weight: 74.7 kg     Height:        Intake/Output Summary (Last 24 hours) at 07/11/2020 1331 Last data filed at 07/11/2020 1226 Gross per 24 hour  Intake 200 ml  Output 1000 ml  Net -800 ml   Filed Weights   07/08/20 0350 07/09/20 0600 07/11/20 0329  Weight: 77.5 kg 74.6 kg 74.7 kg    Examination:  eomi ncat  cta b no added sound no rales no rhonchi s1 s2 RRR abd soft nt nd no rebound no guard Psych euthymic pleasant   Data Reviewed: personally reviewed   CBC    Component Value Date/Time   WBC 11.0 (H) 07/10/2020 0802   RBC 3.21 (L) 07/10/2020 0802   HGB 10.3 (L) 07/10/2020 0802   HCT 30.4 (L) 07/10/2020 0802   PLT 289 07/10/2020 0802   MCV 94.7 07/10/2020 0802   MCH 32.1 07/10/2020 0802   MCHC 33.9 07/10/2020 0802   RDW 12.0 07/10/2020 0802   LYMPHSABS 0.7 07/06/2020 2338   MONOABS 0.8 07/06/2020 2338   EOSABS 0.0 07/06/2020 2338   BASOSABS 0.0 07/06/2020 2338   CMP Latest Ref Rng &  Units 07/10/2020 07/09/2020 07/06/2020  Glucose 70 - 99 mg/dL 211(H) 94 417(E)  BUN 8 - 23 mg/dL 08(X) 44(Y) 18(H)  Creatinine 0.61 - 1.24 mg/dL 6.31(S) 9.70(Y) 6.37(C)  Sodium 135 - 145 mmol/L 132(L) 131(L) 130(L)  Potassium 3.5 - 5.1 mmol/L 4.0 4.3 4.4  Chloride 98 - 111 mmol/L 103 106 102  CO2 22 - 32 mmol/L 18(L) 18(L) 19(L)  Calcium 8.9 - 10.3 mg/dL 5.8(I) 5.0(Y) 7.7(A)  Total Protein 6.5 - 8.1 g/dL - - 6.3(L)  Total Bilirubin 0.3 - 1.2 mg/dL - - 0.9  Alkaline Phos 38 - 126 U/L - - 63  AST 15 - 41 U/L - - 13(L)  ALT 0 - 44 U/L - - 13     Radiology Studies: DG Knee Complete 4  Views Right  Result Date: 07/11/2020 CLINICAL DATA:  Right knee pain and swelling, fall EXAM: RIGHT KNEE - COMPLETE 4+ VIEW COMPARISON:  None. FINDINGS: Four view radiograph right knee demonstrates normal alignment. No fracture or dislocation. Joint spaces are not optimally profiled but appear preserved. Moderate right knee effusion is present. Vascular calcifications are seen within the posterior soft tissues. IMPRESSION: Moderate effusion.  No fracture or dislocation. Electronically Signed   By: Helyn Numbers MD   On: 07/11/2020 03:52     Scheduled Meds: . amLODipine  5 mg Oral q morning  . atorvastatin  20 mg Oral q morning  . Chlorhexidine Gluconate Cloth  6 each Topical Daily  . clopidogrel  75 mg Oral q morning  . colchicine  0.3 mg Oral Once  . colchicine  0.6 mg Oral Once  . donepezil  10 mg Oral BID  . furosemide  40 mg Oral Q M,W,F  . gabapentin  800 mg Oral QPC supper  . montelukast  10 mg Oral q morning  . polyethylene glycol  17 g Oral Daily  . primidone  25 mg Oral QHS  . psyllium  1 packet Oral Daily  . sodium chloride flush  3 mL Intravenous Q12H  . tamsulosin  0.4 mg Oral QHS  . vitamin B-12  1,000 mcg Oral q morning   Continuous Infusions: . sodium chloride    . cefTRIAXone (ROCEPHIN)  IV Stopped (07/11/20 0404)     LOS: 5 days   Time spent: 10  Rhetta Mura, MD [facility] To contact the attending provider between 7A-7P or the covering provider during after hours 7P-7A, please log into the web site [email or web address] and access using universal [place] password for that web site. If you do not have the password, please call the hospital operator.  07/11/2020, 1:31 PM

## 2020-07-11 NOTE — Progress Notes (Signed)
PT Cancellation Note  Patient Details Name: ELIZARDO CHILSON MRN: [id number] DOB: [birth year]   Cancelled Treatment:    Reason Eval/Treat Not Completed: (P) Other (comment) (RN defer as pt with intractable knee pain and MD planning to perform arthrocentesis soon.) Will continue efforts later in day or next date per PT POC as schedule permits.   Dorathy Kinsman Willo Yoon 07/11/2020, 10:47 AM

## 2020-07-12 ENCOUNTER — Encounter (HOSPITAL_COMMUNITY): Payer: Self-pay | Admitting: Cardiology

## 2020-07-12 DIAGNOSIS — S82202A Unspecified fracture of shaft of left tibia, initial encounter for closed fracture: Secondary | ICD-10-CM

## 2020-07-12 DIAGNOSIS — M109 Gout, unspecified: Secondary | ICD-10-CM

## 2020-07-12 HISTORY — DX: Gout, unspecified: M10.9

## 2020-07-12 HISTORY — DX: Unspecified fracture of shaft of left tibia, initial encounter for closed fracture: S82.202A

## 2020-07-12 LAB — CBC WITH DIFFERENTIAL/PLATELET
Abs Immature Granulocytes: 0.06 10*3/uL (ref 0.00–0.07)
Basophils Absolute: 0 10*3/uL (ref 0.0–0.1)
Basophils Relative: 0 %
Eosinophils Absolute: 0.2 10*3/uL (ref 0.0–0.5)
Eosinophils Relative: 2 %
HCT: 29.7 % — ABNORMAL LOW (ref 39.0–52.0)
Hemoglobin: 9.8 g/dL — ABNORMAL LOW (ref 13.0–17.0)
Immature Granulocytes: 1 %
Lymphocytes Relative: 5 %
Lymphs Abs: 0.6 10*3/uL — ABNORMAL LOW (ref 0.7–4.0)
MCH: 31.2 pg (ref 26.0–34.0)
MCHC: 33 g/dL (ref 30.0–36.0)
MCV: 94.6 fL (ref 80.0–100.0)
Monocytes Absolute: 1.8 10*3/uL — ABNORMAL HIGH (ref 0.1–1.0)
Monocytes Relative: 16 %
Neutro Abs: 8.4 10*3/uL — ABNORMAL HIGH (ref 1.7–7.7)
Neutrophils Relative %: 76 %
Platelets: 316 10*3/uL (ref 150–400)
RBC: 3.14 MIL/uL — ABNORMAL LOW (ref 4.22–5.81)
RDW: 12.4 % (ref 11.5–15.5)
WBC: 11.1 10*3/uL — ABNORMAL HIGH (ref 4.0–10.5)
nRBC: 0 % (ref 0.0–0.2)

## 2020-07-12 LAB — COMPREHENSIVE METABOLIC PANEL
ALT: 28 U/L (ref 0–44)
AST: 23 U/L (ref 15–41)
Albumin: 2.3 g/dL — ABNORMAL LOW (ref 3.5–5.0)
Alkaline Phosphatase: 72 U/L (ref 38–126)
Anion gap: 12 (ref 5–15)
BUN: 35 mg/dL — ABNORMAL HIGH (ref 8–23)
CO2: 20 mmol/L — ABNORMAL LOW (ref 22–32)
Calcium: 8.1 mg/dL — ABNORMAL LOW (ref 8.9–10.3)
Chloride: 102 mmol/L (ref 98–111)
Creatinine, Ser: 2.13 mg/dL — ABNORMAL HIGH (ref 0.61–1.24)
GFR, Estimated: 29 mL/min — ABNORMAL LOW (ref >60)
Glucose, Bld: 114 mg/dL — ABNORMAL HIGH (ref 70–99)
Potassium: 4.2 mmol/L (ref 3.5–5.1)
Sodium: 134 mmol/L — ABNORMAL LOW (ref 135–145)
Total Bilirubin: 0.7 mg/dL (ref 0.3–1.2)
Total Protein: 5.9 g/dL — ABNORMAL LOW (ref 6.5–8.1)

## 2020-07-12 LAB — GLUCOSE, BODY FLUID OTHER: Glucose, Body Fluid Other: 54 mg/dL

## 2020-07-12 MED ORDER — SULFAMETHOXAZOLE-TRIMETHOPRIM 400-80 MG PO TABS
1.0000 | ORAL_TABLET | Freq: Two times a day (BID) | ORAL | Status: DC
  Administered 2020-07-12 – 2020-07-15 (×5): 1 via ORAL
  Filled 2020-07-12 (×8): qty 1

## 2020-07-12 MED ORDER — TRAMADOL HCL 50 MG PO TABS
50.0000 mg | ORAL_TABLET | Freq: Two times a day (BID) | ORAL | Status: DC | PRN
  Administered 2020-07-13 – 2020-07-19 (×4): 50 mg via ORAL
  Filled 2020-07-12 (×4): qty 1

## 2020-07-12 MED ORDER — ATORVASTATIN CALCIUM 10 MG PO TABS
20.0000 mg | ORAL_TABLET | Freq: Every day | ORAL | Status: DC
  Administered 2020-07-13 – 2020-07-20 (×8): 20 mg via ORAL
  Filled 2020-07-12 (×8): qty 2

## 2020-07-12 MED ORDER — CLOPIDOGREL BISULFATE 75 MG PO TABS
75.0000 mg | ORAL_TABLET | Freq: Every day | ORAL | Status: DC
  Administered 2020-07-13 – 2020-07-18 (×6): 75 mg via ORAL
  Filled 2020-07-12 (×6): qty 1

## 2020-07-12 MED ORDER — SULFAMETHOXAZOLE-TRIMETHOPRIM 800-160 MG PO TABS: 1.0000 | ORAL_TABLET | Freq: Every day | ORAL | Status: DC

## 2020-07-12 MED ORDER — AMLODIPINE BESYLATE 5 MG PO TABS: 5.0000 mg | ORAL_TABLET | Freq: Every day | ORAL | Status: DC

## 2020-07-12 MED ORDER — AMLODIPINE BESYLATE 10 MG PO TABS
10.0000 mg | ORAL_TABLET | Freq: Every day | ORAL | Status: DC
  Administered 2020-07-13 – 2020-07-20 (×8): 10 mg via ORAL
  Filled 2020-07-12 (×8): qty 1

## 2020-07-12 NOTE — Progress Notes (Signed)
PROGRESS NOTE   Frank Avila  NIO:[id number] DOB: 1929-01-03 DOA: 07/06/2020 PCP: No primary care provider on file.  Brief Narrative:  87 year old veteran white male baseline wheelchair-bound CABG 1993, PCI 2012 followed by Dr. Vernona Rieger at [facility] Spinal muscular dystrophy HTN HLD Admit 07/06/2020 by cardiology secondary to LOC in the setting of recent addition of medication for tremors Found to be bradycardic rates in the 20s-Rx atropine by [facility] emergency room transvenous pacing wire placed Also found to have distal left tib-fib fracture  Cardiology consulted--bradycardia felt 2/2 propanolol Orthopedics consulted in addition Overnight 6/8 developed severe right knee pain--underwent arthrocentesis showing monosodium urate crystals   Hospital-Problem based course  Acute gout flare Rx colchicine limited Rx Sonovial cultures 6/8 no growth-no antibiotics Improving Beta-blocker mediated bradycardia Cardiology admitted the patient but they have signed off-transvenous pacing has been discontinued- is in sinus rhythm Do not resume beta-blocker-marked as allergy Distal left tib-fib fracture Defer to orthopedics-tramadol for moderate pain-pain seems controlled at this time UTI? Follow BC x 2, follow UC Urine analysis suggestive of cystitis with leukocytes, nitrites Bladder scan performed by nursing 6/9 did not show any retention Flomax 0.4 daily Started limited course of Bactrim 6/9 HTN Amlodipine increased from 5-->10 qam. Lasix 40 m-w-f--this is stable CABG CAD 1993 Plavix 75 daily, no BB Consider OP ARB  DVT prophylaxis: SCD Code Status: FULL Family Communication: none presently at bedside Disposition:  Status is: Inpatient  Remains inpatient appropriate because:Hemodynamically unstable, Altered mental status and Unsafe d/c plan   Dispo: The patient is from: Home              Anticipated d/c is to: Home              Patient currently is not medically stable  to d/c.   Difficult to place patient Yes  Consultants:  ORtho  Procedures:   Antimicrobials:     Subjective:  Doing fair Tells me that his right knee is no better-still having moderate amounts of pain Asking about postop boot for his other leg He is oriented and coherent  Objective: Vitals:   07/12/20 0015 07/12/20 0417 07/12/20 0811 07/12/20 1423  BP: (!) 133/53 (!) 116/44 (!) 121/52 (!) 136/57  Pulse: 77 71 74 73  Resp: 20 20 20 18   Temp: 99 F (37.2 C) 99.5 F (37.5 C) 99.4 F (37.4 C) 98.9 F (37.2 C)  TempSrc: Oral Oral Oral Oral  SpO2: 97% 95% 96% 96%  Weight:      Height:        Intake/Output Summary (Last 24 hours) at 07/12/2020 1648 Last data filed at 07/12/2020 1300 Gross per 24 hour  Intake 240 ml  Output 650 ml  Net -410 ml    Filed Weights   07/08/20 0350 07/09/20 0600 07/11/20 0329  Weight: 77.5 kg 74.6 kg 74.7 kg    Examination:  eomi ncat no focal deficit looks younger than stated age  Chest is clear no rales no rhonchi s1 s2 RRR abd soft nt nd no rebound no guard Psych euthymic pleasant   Data Reviewed: personally reviewed   CBC    Component Value Date/Time   WBC 11.1 (H) 07/12/2020 0134   RBC 3.14 (L) 07/12/2020 0134   HGB 9.8 (L) 07/12/2020 0134   HCT 29.7 (L) 07/12/2020 0134   PLT 316 07/12/2020 0134   MCV 94.6 07/12/2020 0134   MCH 31.2 07/12/2020 0134   MCHC 33.0 07/12/2020 0134   RDW 12.4 07/12/2020 0134  LYMPHSABS 0.6 (L) 07/12/2020 0134   MONOABS 1.8 (H) 07/12/2020 0134   EOSABS 0.2 07/12/2020 0134   BASOSABS 0.0 07/12/2020 0134   CMP Latest Ref Rng & Units 07/12/2020 07/10/2020 07/09/2020  Glucose 70 - 99 mg/dL 923(R) 007(M) 94  BUN 8 - 23 mg/dL 22(Q) 33(H) 54(T)  Creatinine 0.61 - 1.24 mg/dL 6.25(W) 3.89(H) 7.34(K)  Sodium 135 - 145 mmol/L 134(L) 132(L) 131(L)  Potassium 3.5 - 5.1 mmol/L 4.2 4.0 4.3  Chloride 98 - 111 mmol/L 102 103 106  CO2 22 - 32 mmol/L 20(L) 18(L) 18(L)  Calcium 8.9 - 10.3 mg/dL 8.1(L) 8.6(L)  8.2(L)  Total Protein 6.5 - 8.1 g/dL 5.9(L) - -  Total Bilirubin 0.3 - 1.2 mg/dL 0.7 - -  Alkaline Phos 38 - 126 U/L 72 - -  AST 15 - 41 U/L 23 - -  ALT 0 - 44 U/L 28 - -     Radiology Studies: DG Knee Complete 4 Views Right  Result Date: 07/11/2020 CLINICAL DATA:  Right knee pain and swelling, fall EXAM: RIGHT KNEE - COMPLETE 4+ VIEW COMPARISON:  None. FINDINGS: Four view radiograph right knee demonstrates normal alignment. No fracture or dislocation. Joint spaces are not optimally profiled but appear preserved. Moderate right knee effusion is present. Vascular calcifications are seen within the posterior soft tissues. IMPRESSION: Moderate effusion.  No fracture or dislocation. Electronically Signed   By: Helyn Numbers MD   On: 07/11/2020 03:52      Scheduled Meds:  Melene Muller ON 07/13/2020] amLODipine  10 mg Oral Daily   [START ON 07/13/2020] atorvastatin  20 mg Oral Daily   Chlorhexidine Gluconate Cloth  6 each Topical Daily   [START ON 07/13/2020] clopidogrel  75 mg Oral Daily   donepezil  10 mg Oral BID   furosemide  40 mg Oral Q M,W,F   gabapentin  800 mg Oral QPC supper   montelukast  10 mg Oral q morning   polyethylene glycol  17 g Oral Daily   primidone  25 mg Oral QHS   psyllium  1 packet Oral Daily   sodium chloride flush  3 mL Intravenous Q12H   sulfamethoxazole-trimethoprim  1 tablet Oral Q12H   tamsulosin  0.4 mg Oral QHS   vitamin B-12  1,000 mcg Oral q morning   Continuous Infusions:  sodium chloride       LOS: 6 days   Time spent: 70  Rhetta Mura, MD [facility] To contact the attending provider between 7A-7P or the covering provider during after hours 7P-7A, please log into the web site [email or web address] and access using universal [place] password for that web site. If you do not have the password, please call the hospital operator.  07/12/2020, 4:48 PM

## 2020-07-12 NOTE — Progress Notes (Signed)
Mobility Specialist: Progress Note   07/12/20 1740  Mobility  Activity Dangled on edge of bed  Level of Assistance +2 (takes two people)  Assistive Device None  Mobility Sit up in bed/chair position for meals  Mobility Response Tolerated poorly  Mobility performed by Mobility specialist;Other (comment) (PTA Carly)  $Mobility charge 1 Mobility   Post-Mobility: 78 HR, 139/56 BP, 100% SpO2  Pt required modA to maxA to sit EOB from supine. After sitting EOB pt need modA for trunk stability during session. Pt c/o pain in BLE, no rating given. Pt required maxA +2 to get back into bed after session. Pt back in bed with call bell at his side and bed alarm on.   [facility] Frank Avila Mobility Specialist Mobility Specialist Phone: [phone]

## 2020-07-12 NOTE — [place] FL2 (Signed)
[place] MEDICAID FL2 LEVEL OF CARE SCREENING TOOL     IDENTIFICATION  Patient Name: Frank Avila Birthdate: 1929-01-10 Sex: male Admission Date (Current Location): 07/06/2020  [facility] and [place] Number:  Producer, television/film/video and Address:  [facility], [address], [place], [place] [address]      Provider Number: [id number]  Attending Physician Name and Address:  Rhetta Mura, MD  Relative Name and Phone Number:  Loel Dubonnet,  [phone]    Current Level of Care: Hospital Recommended Level of Care: Skilled Nursing Facility Prior Approval Number:    Date Approved/Denied:   PASRR Number: [id number]  Discharge Plan: SNF    Current Diagnoses: Patient Active Problem List   Diagnosis Date Noted   CAD (coronary artery disease) of artery bypass graft 07/07/2020   Tremor 07/07/2020   Leukocytosis 07/07/2020   Symptomatic bradycardia 07/06/2020    Orientation RESPIRATION BLADDER Height & Weight     Self, Time, Situation, Place  Normal Continent, External catheter Weight: 164 lb 10.9 oz (74.7 kg) Height:  5\' 8"  (172.7 cm)  BEHAVIORAL SYMPTOMS/MOOD NEUROLOGICAL BOWEL NUTRITION STATUS      Continent Diet (See D/C Summary)  AMBULATORY STATUS COMMUNICATION OF NEEDS Skin   Limited Assist Verbally Other (Comment) (Wound Incision open or dehiced laceration,face,upper)                       Personal Care Assistance Level of Assistance  Feeding, Bathing, Dressing Bathing Assistance: Limited assistance Feeding assistance: Limited assistance Dressing Assistance: Limited assistance     Functional Limitations Info  Sight, Hearing, Speech Sight Info: Adequate Hearing Info: Adequate Speech Info: Adequate    SPECIAL CARE FACTORS FREQUENCY  PT (By licensed PT), OT (By licensed OT)     PT Frequency: 5x/week OT Frequency: 5x/week            Contractures Contractures Info: Not present    Additional Factors Info  Code  Status, Allergies Code Status Info: Full Allergies Info: Beta Adrenergic Blockers, Codeine, Penicillins           Current Medications (07/12/2020):  This is the current hospital active medication list Current Facility-Administered Medications  Medication Dose Route Frequency Provider Last Rate Last Admin   0.9 %  sodium chloride infusion  250 mL Intravenous PRN Nahser, 09/11/2020, MD       acetaminophen (TYLENOL) tablet 500 mg  500 mg Oral TID PRN Nahser, Deloris Ping, MD   500 mg at 07/11/20 0903   amLODipine (NORVASC) tablet 5 mg  5 mg Oral q morning Nahser, 09/10/20, MD   5 mg at 07/12/20 0859   atorvastatin (LIPITOR) tablet 20 mg  20 mg Oral q morning Nahser, 09/11/20, MD   20 mg at 07/12/20 09/11/20   Chlorhexidine Gluconate Cloth 2 % PADS 6 each  6 each Topical Daily Nahser, 5573, MD   6 each at 07/12/20 0855   clopidogrel (PLAVIX) tablet 75 mg  75 mg Oral q morning Nahser, 09/11/20, MD   75 mg at 07/12/20 0859   donepezil (ARICEPT) tablet 10 mg  10 mg Oral BID Nahser, 09/11/20, MD   10 mg at 07/12/20 0853   furosemide (LASIX) tablet 40 mg  40 mg Oral Q M,W,F Nahser, 09/11/20, MD   40 mg at 07/11/20 09/10/20   gabapentin (NEURONTIN) capsule 800 mg  800 mg Oral QPC supper Nahser, 2202, MD   800 mg  at 07/11/20 1638   lidocaine (LMX) 4 % cream   Topical TID PRN Marinda Elk, MD   1 application at 07/11/20 0216   melatonin tablet 3 mg  3 mg Oral QHS PRN Nahser, Deloris Ping, MD   3 mg at 07/11/20 0035   montelukast (SINGULAIR) tablet 10 mg  10 mg Oral q morning Nahser, Deloris Ping, MD   10 mg at 07/12/20 0859   ondansetron (ZOFRAN) injection 4 mg  4 mg Intravenous Q6H PRN Nahser, Deloris Ping, MD   4 mg at 07/11/20 0228   polyethylene glycol (MIRALAX / GLYCOLAX) packet 17 g  17 g Oral Daily Nahser, Deloris Ping, MD   17 g at 07/12/20 6063   primidone (MYSOLINE) tablet 25 mg  25 mg Oral QHS Nahser, Deloris Ping, MD   25 mg at 07/11/20 2115   psyllium (HYDROCIL/METAMUCIL) 1 packet  1 packet Oral Daily Nahser,  Deloris Ping, MD   1 packet at 07/12/20 0853   senna-docusate (Senokot-S) tablet 1 tablet  1 tablet Oral QHS PRN John Giovanni, MD   1 tablet at 07/11/20 2115   sodium chloride flush (NS) 0.9 % injection 3 mL  3 mL Intravenous Q12H Nahser, Deloris Ping, MD   3 mL at 07/12/20 0854   sodium chloride flush (NS) 0.9 % injection 3 mL  3 mL Intravenous PRN Nahser, Deloris Ping, MD   3 mL at 07/11/20 0227   sulfamethoxazole-trimethoprim (BACTRIM) 400-80 MG per tablet 1 tablet  1 tablet Oral Q12H Scarlett Presto, RPH       tamsulosin [facility]) capsule 0.4 mg  0.4 mg Oral QHS Nahser, Deloris Ping, MD   0.4 mg at 07/11/20 2115   traMADol (ULTRAM) tablet 50 mg  50 mg Oral Q12H PRN Rhetta Mura, MD       vitamin B-12 (CYANOCOBALAMIN) tablet 1,000 mcg  1,000 mcg Oral q morning Nahser, Deloris Ping, MD   1,000 mcg at 07/12/20 0160     Discharge Medications: Please see discharge summary for a list of discharge medications.  Relevant Imaging Results:  Relevant Lab Results:   Additional Information SSN#: [id number]  Shrihaan Porzio, LCSWA

## 2020-07-12 NOTE — Progress Notes (Addendum)
PROGRESS NOTE   Frank Avila  URK:[id number] DOB: Nov 27, 1928 DOA: 07/06/2020 PCP: No primary care provider on file.  Brief Narrative:  87 year old veteran white male baseline wheelchair-bound CABG 1993, PCI 2012 followed by Dr. Vernona Rieger at [facility] Spinal muscular dystrophy HTN HLD Admit 07/06/2020 by cardiology secondary to LOC in the setting of recent addition of medication for tremors Found to be bradycardic rates in the 20s-Rx atropine by [facility] emergency room transvenous pacing wire placed Also found to have distal left tib-fib fracture  Cardiology consulted--bradycardia felt 2/2 propanolol Orthopedics consulted in addition Overnight 6/8 developed severe right knee pain--underwent arthrocentesis showing monosodium urate crystals   Hospital-Problem based course  Acute gout flare Rx colchicine limited Rx Sonovial cultures 6/8 no growth-no antibiotics Improving Chronic essential tremors Patient was being treated with propanolol which has been stopped secondary to the bradycardia Continue primidone low-dose 25 mg Beta-blocker mediated bradycardia Cardiology admitted the patient but they have signed off-transvenous pacing has been discontinued- is in sinus rhythm Do not resume beta-blocker-marked as allergy Continue Plavix 75 mg Distal left tib-fib fracture Defer to orthopedics-tramadol for moderate pain-pain seems controlled at this time Careful use of gabapentin-May need to cut back dose from 800 UTI? Follow BC x 2, follow UC Urine analysis suggestive of cystitis with leukocytes, nitrites Bladder scan performed by nursing 6/9 did not show any retention Flomax 0.4 daily Started limited course of Bactrim 6/9 HTN Amlodipine increased from 5-->10 qam. CABG CAD 1993 Plavix 75 daily, no BB Consider OP ARB AKI secondary to ATN poor p.o. intake Stop Monday Wednesday Friday Lasix 40 mg for now If persists may need to add IV fluid   DVT prophylaxis: SCD Code  Status: FULL Family Communication: none presently at bedside Disposition:  Status is: Inpatient  Remains inpatient appropriate because:Hemodynamically unstable, Altered mental status and Unsafe d/c plan   Dispo: The patient is from: Home              Anticipated d/c is to: Home              Patient currently is not medically stable to d/c.   Difficult to place patient Yes  Consultants:  ORtho  Procedures:   Antimicrobials:     Subjective:  Doing fair Tells me that his right knee is no better-still having moderate amounts of pain Asking about postop boot for his other leg He is oriented and coherent  Objective: Vitals:   07/12/20 0417 07/12/20 0811 07/12/20 1423 07/12/20 1736  BP: (!) 116/44 (!) 121/52 (!) 136/57 (!) 139/56  Pulse: 71 74 73 72  Resp: 20 20 18 18   Temp: 99.5 F (37.5 C) 99.4 F (37.4 C) 98.9 F (37.2 C) 98.7 F (37.1 C)  TempSrc: Oral Oral Oral Oral  SpO2: 95% 96% 96% 97%  Weight:      Height:        Intake/Output Summary (Last 24 hours) at 07/12/2020 1822 Last data filed at 07/12/2020 1813 Gross per 24 hour  Intake 240 ml  Output 1000 ml  Net -760 ml    Filed Weights   07/08/20 0350 07/09/20 0600 07/11/20 0329  Weight: 77.5 kg 74.6 kg 74.7 kg    Examination:  eomi ncat no focal deficit looks younger than stated age  Chest is clear no rales no rhonchi s1 s2 RRR abd soft nt nd no rebound no guard Psych euthymic pleasant   Data Reviewed: personally reviewed   CBC    Component Value Date/Time  WBC 11.1 (H) 07/12/2020 0134   RBC 3.14 (L) 07/12/2020 0134   HGB 9.8 (L) 07/12/2020 0134   HCT 29.7 (L) 07/12/2020 0134   PLT 316 07/12/2020 0134   MCV 94.6 07/12/2020 0134   MCH 31.2 07/12/2020 0134   MCHC 33.0 07/12/2020 0134   RDW 12.4 07/12/2020 0134   LYMPHSABS 0.6 (L) 07/12/2020 0134   MONOABS 1.8 (H) 07/12/2020 0134   EOSABS 0.2 07/12/2020 0134   BASOSABS 0.0 07/12/2020 0134   CMP Latest Ref Rng & Units 07/12/2020 07/10/2020  07/09/2020  Glucose 70 - 99 mg/dL 253(G) 644(I) 94  BUN 8 - 23 mg/dL 34(V) 42(V) 95(G)  Creatinine 0.61 - 1.24 mg/dL 3.87(F) 6.43(P) 2.95(J)  Sodium 135 - 145 mmol/L 134(L) 132(L) 131(L)  Potassium 3.5 - 5.1 mmol/L 4.2 4.0 4.3  Chloride 98 - 111 mmol/L 102 103 106  CO2 22 - 32 mmol/L 20(L) 18(L) 18(L)  Calcium 8.9 - 10.3 mg/dL 8.1(L) 8.6(L) 8.2(L)  Total Protein 6.5 - 8.1 g/dL 5.9(L) - -  Total Bilirubin 0.3 - 1.2 mg/dL 0.7 - -  Alkaline Phos 38 - 126 U/L 72 - -  AST 15 - 41 U/L 23 - -  ALT 0 - 44 U/L 28 - -     Radiology Studies: DG Knee Complete 4 Views Right  Result Date: 07/11/2020 CLINICAL DATA:  Right knee pain and swelling, fall EXAM: RIGHT KNEE - COMPLETE 4+ VIEW COMPARISON:  None. FINDINGS: Four view radiograph right knee demonstrates normal alignment. No fracture or dislocation. Joint spaces are not optimally profiled but appear preserved. Moderate right knee effusion is present. Vascular calcifications are seen within the posterior soft tissues. IMPRESSION: Moderate effusion.  No fracture or dislocation. Electronically Signed   By: Helyn Numbers MD   On: 07/11/2020 03:52      Scheduled Meds:  Melene Muller ON 07/13/2020] amLODipine  10 mg Oral Daily   [START ON 07/13/2020] atorvastatin  20 mg Oral Daily   Chlorhexidine Gluconate Cloth  6 each Topical Daily   [START ON 07/13/2020] clopidogrel  75 mg Oral Daily   donepezil  10 mg Oral BID   furosemide  40 mg Oral Q M,W,F   gabapentin  800 mg Oral QPC supper   montelukast  10 mg Oral q morning   polyethylene glycol  17 g Oral Daily   primidone  25 mg Oral QHS   psyllium  1 packet Oral Daily   sodium chloride flush  3 mL Intravenous Q12H   sulfamethoxazole-trimethoprim  1 tablet Oral Q12H   tamsulosin  0.4 mg Oral QHS   vitamin B-12  1,000 mcg Oral q morning   Continuous Infusions:  sodium chloride       LOS: 6 days   Time spent: 74  Rhetta Mura, MD [facility] To contact the attending provider between  7A-7P or the covering provider during after hours 7P-7A, please log into the web site [email or web address] and access using universal [place] password for that web site. If you do not have the password, please call the hospital operator.  07/12/2020, 6:22 PM

## 2020-07-12 NOTE — Progress Notes (Signed)
PHARMACY NOTE:  ANTIMICROBIAL RENAL DOSAGE ADJUSTMENT  Current antimicrobial regimen includes a mismatch between antimicrobial dosage and estimated renal function.  As per policy approved by the Pharmacy & Therapeutics and Medical Executive Committees, the antimicrobial dosage will be adjusted accordingly.  Current antimicrobial dosage:    Ceftriaxone 1 gm IV q12h > Septra DS 1 tablet daily  Indication: UTI  Renal Function:  Estimated Creatinine Clearance: 22.3 mL/min (A) (by C-G formula based on SCr of 2.13 mg/dL (H)). []      On intermittent HD, scheduled: []      On CRRT    Antimicrobial dosage has been changed to:    Ceftriaxone stopped.  Septra DS change to 1 single-strength tablet BID.  Additional comments:  Tramadol 50 mg q6h prn moderate pain also changed to q12h prn for crcl <30 ml/ml.  Hasn't used since 6/5. Currently using prn Tylenol.   Thank you for allowing pharmacy to be a part of this patient's care.  , 8/5 07/12/2020 9:32 AM

## 2020-07-12 NOTE — TOC Progression Note (Addendum)
Transition of Care [facility]) - Progression Note    Patient Details  Name: Frank Avila MRN: [id number] Date of Birth: 1928/03/21  [facility] CM/SW Contact  Eduard Roux, [place] Phone Number: 07/12/2020, 1:44 PM  Clinical Narrative:     [name] [phone]- confirmed its been reported the patient is in the hospital- Approval # [id number] [facility] Mendel Corning [phone] ext [phone] PCP DR. Norwood He is 100% [facility]   CSW has faxed clincals to [place] for SNF approval  CSW will continue to follow and assist with discharge planning.  Antony Blackbird, MSW, LCSW Clinical Social Worker    Expected Discharge Plan: Skilled Nursing Facility Barriers to Discharge: Continued Medical Work up  Expected Discharge Plan and Services Expected Discharge Plan: Skilled Nursing Facility In-house Referral: Clinical Social Work     Living arrangements for the past 2 months: Single Family Home                                       Social Determinants of Health (SDOH) Interventions    Readmission Risk Interventions No flowsheet data found.

## 2020-07-12 NOTE — Progress Notes (Signed)
Orthopaedic Trauma Service Progress Note  Patient ID: Frank Avila MRN: [id number] DOB/AGE: [birth year] 85 y.o.  Subjective:  R knee doing better  Synovial fluid analysis consistent with gout  No growth to date on cultures  Started on colchicine yesterday   Left leg is doing well   ROS As above  Objective:   VITALS:   Vitals:   07/11/20 2004 07/12/20 0015 07/12/20 0417 07/12/20 0811  BP: (!) 129/51 (!) 133/53 (!) 116/44 (!) 121/52  Pulse: 69 77 71 74  Resp: 20 20 20 20   Temp: 99.8 F (37.7 C) 99 F (37.2 C) 99.5 F (37.5 C) 99.4 F (37.4 C)  TempSrc: Oral Oral Oral Oral  SpO2: 97% 97% 95% 96%  Weight:      Height:        Estimated body mass index is 25.04 kg/m as calculated from the following:   Height as of this encounter: 5\' 8"  (1.727 m).   Weight as of this encounter: 74.7 kg.   Intake/Output      06/08 0701 06/09 0700 06/09 0701 06/10 0700   P.O. 337    IV Piggyback     Total Intake(mL/kg) 337 (4.5)    Urine (mL/kg/hr) 1200 (0.7) 150 (0.4)   Total Output 1200 150   Net -863 -150          LABS  Results for orders placed or performed during the hospital encounter of 07/06/20 (from the past 24 hour(s))  Comprehensive metabolic panel     Status: Abnormal   Collection Time: 07/12/20  1:34 AM  Result Value Ref Range   Sodium 134 (L) 135 - 145 mmol/L   Potassium 4.2 3.5 - 5.1 mmol/L   Chloride 102 98 - 111 mmol/L   CO2 20 (L) 22 - 32 mmol/L   Glucose, Bld 114 (H) 70 - 99 mg/dL   BUN 35 (H) 8 - 23 mg/dL   Creatinine, Ser 09/05/20 (H) 0.61 - 1.24 mg/dL   Calcium 8.1 (L) 8.9 - 10.3 mg/dL   Total Protein 5.9 (L) 6.5 - 8.1 g/dL   Albumin 2.3 (L) 3.5 - 5.0 g/dL   AST 23 15 - 41 U/L   ALT 28 0 - 44 U/L   Alkaline Phosphatase 72 38 - 126 U/L   Total Bilirubin 0.7 0.3 - 1.2 mg/dL   GFR, Estimated 29 (L) >60 mL/min   Anion gap 12 5 - 15  CBC with Differential/Platelet      Status: Abnormal   Collection Time: 07/12/20  1:34 AM  Result Value Ref Range   WBC 11.1 (H) 4.0 - 10.5 K/uL   RBC 3.14 (L) 4.22 - 5.81 MIL/uL   Hemoglobin 9.8 (L) 13.0 - 17.0 g/dL   HCT 9.67 (L) 09/11/20 - 89.3 %   MCV 94.6 80.0 - 100.0 fL   MCH 31.2 26.0 - 34.0 pg   MCHC 33.0 30.0 - 36.0 g/dL   RDW 81.0 17.5 - 10.2 %   Platelets 316 150 - 400 K/uL   nRBC 0.0 0.0 - 0.2 %   Neutrophils Relative % 76 %   Neutro Abs 8.4 (H) 1.7 - 7.7 K/uL   Lymphocytes Relative 5 %   Lymphs Abs 0.6 (L) 0.7 - 4.0 K/uL   Monocytes Relative 16 %   Monocytes Absolute 1.8 (  H) 0.1 - 1.0 K/uL   Eosinophils Relative 2 %   Eosinophils Absolute 0.2 0.0 - 0.5 K/uL   Basophils Relative 0 %   Basophils Absolute 0.0 0.0 - 0.1 K/uL   Immature Granulocytes 1 %   Abs Immature Granulocytes 0.06 0.00 - 0.07 K/uL     PHYSICAL EXAM:   Gen: Sitting up in bed, appears to be resting comfortably Lungs: Unlabored Ext:       Left lower extremity             Splint is clean, dry and intact             Swelling is well controlled             No acute findings noted             Motor and sensory functions at baseline        right lower extremity             knee with minimal effusion   No erythema   Moving knee without difficulty when asked              Chronic muscle atrophy noted             Motor and sensory functions at baseline             No acute findings noted to the hip or the ankle             Extremity is warm             + DP pulse    Assessment/Plan:     Active Problems:   Symptomatic bradycardia   CAD (coronary artery disease) of artery bypass graft   Tremor   Leukocytosis   Acute gout of knee, Right   Closed fracture of shaft of left tibia   Anti-infectives (From admission, onward)    Start     Dose/Rate Route Frequency Ordered Stop   07/12/20 1000  sulfamethoxazole-trimethoprim (BACTRIM DS) 800-160 MG per tablet 1 tablet  Status:  Discontinued       Note to Pharmacy: Indication ? UTI--no  culture to guide--u may adjust dose as you see fit--thanks   1 tablet Oral Daily 07/12/20 0834 07/12/20 0908   07/12/20 1000  sulfamethoxazole-trimethoprim (BACTRIM) 400-80 MG per tablet 1 tablet        1 tablet Oral Every 12 hours 07/12/20 0908     07/11/20 0330  cefTRIAXone (ROCEPHIN) 1 g in sodium chloride 0.9 % 100 mL IVPB  Status:  Discontinued        1 g 200 mL/hr over 30 Minutes Intravenous Every 24 hours 07/11/20 0241 07/12/20 0931     .  POD/HD#:   87 year old male syncopal episode admitted for cardiac event with left distal tibia fracture   -fall   -Left extra-articular distal tibia fracture, intact fibula             Continue nonoperative treatment left distal tibia fracture             Will convert to short leg cast prior to discharge             Continue with ice and elevation             Nonweightbearing left leg   -Right knee effusion with acute right knee pain, Acute gout flare R knee              s/p R knee  arthrocentesis    Fluid analysis consistent with acute gout    Colchicine started yesterday    If refractory to colchicine we can perform intraarticular steroid injection    WBAT R leg as needed to help with transfers  No ROM restrictions   Ice prn for symptom management               - Dispo:             Continue per cardiology and medicine service             Please let us know when the patient is expected to discharge and we can convert him to a short leg cast             Follow-up with orthopedics 2 weeks from discharge   Mearl Latin, PA-C [phone] (C) 07/12/2020, 12:38 PM  [facility] [address] [place] [address] [phone] Val [phone] (F)    After 5pm and on the weekends please log on to Amion, go to orthopaedics and the look under the [facility] Call for the provider(s) on call. You can also call our office at [phone] and then follow the prompts to be connected to the call team.

## 2020-07-12 NOTE — Progress Notes (Signed)
Physical Therapy Treatment Patient Details Name: Frank Avila MRN: [id number] DOB: Sep 23, 1928 Today's Date: 07/12/2020    History of Present Illness 87 yo admitted 6/3 after syncope while in [place] which pt slid out of WC getting his LLE caught. He was bradycardic with 2nd degree AV block resolved with medication. pt found to have LLE tibia fx treated non operatively. PMhx: HTN, HLD, CAD s/p CABG, muscular dystrophy (diagnosed when he was 87 y/o).    PT Comments    Pt received in supine, agreeable to therapy session with heavy encouragement and with fair participation and tolerance for seated balance activity and seated scooting. Pt limited due to R knee pain and demonstrates poor to zero trunk control this date even with BUE support. He needs max to total A +2 for all functional mobility tasks. BP stable pre/post exertion and pt denies dizziness. Pt tangential and internally distracted due to pain. Pt continues to benefit from PT services to progress toward functional mobility goals. Continue to recommend SNF.   Follow Up Recommendations  SNF     Equipment Recommendations  None recommended by PT    Recommendations for Other Services       Precautions / Restrictions Precautions Precautions: Fall Precaution Comments: per pt no muscular control in LLE and foot drop in RLE, pt also with poor trunk control 6/9 Restrictions Weight Bearing Restrictions: Yes RLE Weight Bearing: Weight bearing as tolerated LLE Weight Bearing: Non weight bearing    Mobility  Bed Mobility Overal bed mobility: Needs Assistance Bed Mobility: Supine to Sit;Sit to Supine     Supine to sit: Max assist;HOB elevated;+2 for physical assistance;+2 for safety/equipment Sit to supine: Total assist;+2 for physical assistance   General bed mobility comments: Assist to advance B LE and heavy assist to get trunk upright, very fatigued when returning to supine needs totalA +2    Transfers Overall transfer level:  Needs assistance   Transfers: Lateral/Scoot Transfers          Lateral/Scoot Transfers: Max assist;+2 physical assistance;+2 safety/equipment;From elevated surface General transfer comment: Pt initially able to scoot along EOB towards HOB but noted to scoot forward rather than laterally (requiring cues and physical assist) and required Max A x 2 transfer pad assist with assist from mobility tech for trunk control due to posterior lean; pt also having difficulty scooting hips backward onto bed  Ambulation/Gait             General Gait Details: pt non ambulatory at baseline   Stairs             Wheelchair Mobility    Modified Rankin (Stroke Patients Only)       Balance Overall balance assessment: Needs assistance Sitting-balance support: Bilateral upper extremity supported;Feet supported (R foot supported and foot blocked)   Sitting balance - Comments: initially needing max to totalA trunk support then progressed to modA but with fatigue needing totalA, posterior LOB when therapist attempts to have pt self-support in static sitting EOB Postural control: Posterior lean                                  Cognition Arousal/Alertness: Awake/alert Behavior During Therapy: WFL for tasks assessed/performed Overall Cognitive Status: Impaired/Different from baseline Area of Impairment: Safety/judgement;Following commands;Attention                   Current Attention Level: Focused   Following Commands: Follows one  step commands with increased time;Follows one step commands inconsistently Safety/Judgement: Decreased awareness of safety;Decreased awareness of deficits     General Comments: Pt required assist for all mobility but failed to recognize how this is a negative implication for living alone. pt with very poor trunk control although per previous report he was able to perform transfers with minA so may be affected also by pain meds?       Exercises General Exercises - Lower Extremity Ankle Circles/Pumps: PROM;10 reps;Seated (foot drop at baseline per pt) Long Arc Quad: AAROM;Right;10 reps;Seated (pt reports unable then able to partial ROM with AA, pain limiting unable to achieve full extension)    General Comments General comments (skin integrity, edema, etc.): BP 147/62 (88) seated EOB then BP 139/56 (81) supine post-exertion; HR 70's bpm and SpO2 96% on RA      Pertinent Vitals/Pain Pain Assessment: Faces Faces Pain Scale: Hurts whole lot Pain Location: R knee with ROM or WB (dx with gout) Pain Descriptors / Indicators: Sharp;Sore;Moaning;Grimacing;Discomfort Pain Intervention(s): Limited activity within patient's tolerance;Monitored during session;Premedicated before session;Repositioned    Home Living                      Prior Function            PT Goals (current goals can now be found in the care plan section) Acute Rehab PT Goals Patient Stated Goal: return home PT Goal Formulation: With patient Time For Goal Achievement: 07/23/20 Potential to Achieve Goals: Fair Progress towards PT goals: Progressing toward goals    Frequency    Min 3X/week      PT Plan Current plan remains appropriate    Co-evaluation              AM-PAC PT "6 Clicks" Mobility   Outcome Measure  Help needed turning from your back to your side while in a flat bed without using bedrails?: A Lot Help needed moving from lying on your back to sitting on the side of a flat bed without using bedrails?: A Lot Help needed moving to and from a bed to a chair (including a wheelchair)?: Total Help needed standing up from a chair using your arms (e.g., wheelchair or bedside chair)?: Total Help needed to walk in hospital room?: Total Help needed climbing 3-5 steps with a railing? : Total 6 Click Score: 8    End of Session   Activity Tolerance: Patient limited by pain Patient left: in bed;with call bell/phone within  reach;with bed alarm set;Other (comment) (bed in chair position, RLE elevated on cushions x2) Nurse Communication: Mobility status;Need for lift equipment;Other (comment) (likely needs mechanical lift for OOBTC due to R knee pain/poor trunk control) PT Visit Diagnosis: Other abnormalities of gait and mobility (R26.89);Muscle weakness (generalized) (M62.81)     Time: 1950-9326 PT Time Calculation (min) (ACUTE ONLY): 21 min  Charges:  $Therapeutic Activity: 8-22 mins                     Lemoine Goyne P., PTA Acute Rehabilitation Services Pager: [phone] Office: [phone]    Dorathy Kinsman Quentin Strebel 07/12/2020, 6:08 PM

## 2020-07-13 DIAGNOSIS — R001 Bradycardia, unspecified: Secondary | ICD-10-CM

## 2020-07-13 DIAGNOSIS — K5900 Constipation, unspecified: Secondary | ICD-10-CM

## 2020-07-13 DIAGNOSIS — R110 Nausea: Secondary | ICD-10-CM

## 2020-07-13 LAB — CBC WITH DIFFERENTIAL/PLATELET
Abs Immature Granulocytes: 0.05 10*3/uL (ref 0.00–0.07)
Basophils Absolute: 0.1 10*3/uL (ref 0.0–0.1)
Basophils Relative: 1 %
Eosinophils Absolute: 0.5 10*3/uL (ref 0.0–0.5)
Eosinophils Relative: 5 %
HCT: 28.5 % — ABNORMAL LOW (ref 39.0–52.0)
Hemoglobin: 9.5 g/dL — ABNORMAL LOW (ref 13.0–17.0)
Immature Granulocytes: 1 %
Lymphocytes Relative: 6 %
Lymphs Abs: 0.6 10*3/uL — ABNORMAL LOW (ref 0.7–4.0)
MCH: 31.3 pg (ref 26.0–34.0)
MCHC: 33.3 g/dL (ref 30.0–36.0)
MCV: 93.8 fL (ref 80.0–100.0)
Monocytes Absolute: 1.7 10*3/uL — ABNORMAL HIGH (ref 0.1–1.0)
Monocytes Relative: 16 %
Neutro Abs: 7.7 10*3/uL (ref 1.7–7.7)
Neutrophils Relative %: 71 %
Platelets: 332 10*3/uL (ref 150–400)
RBC: 3.04 MIL/uL — ABNORMAL LOW (ref 4.22–5.81)
RDW: 12.3 % (ref 11.5–15.5)
WBC: 10.6 10*3/uL — ABNORMAL HIGH (ref 4.0–10.5)
nRBC: 0 % (ref 0.0–0.2)

## 2020-07-13 LAB — COMPREHENSIVE METABOLIC PANEL
ALT: 47 U/L — ABNORMAL HIGH (ref 0–44)
AST: 45 U/L — ABNORMAL HIGH (ref 15–41)
Albumin: 2.2 g/dL — ABNORMAL LOW (ref 3.5–5.0)
Alkaline Phosphatase: 94 U/L (ref 38–126)
Anion gap: 12 (ref 5–15)
BUN: 37 mg/dL — ABNORMAL HIGH (ref 8–23)
CO2: 20 mmol/L — ABNORMAL LOW (ref 22–32)
Calcium: 7.8 mg/dL — ABNORMAL LOW (ref 8.9–10.3)
Chloride: 100 mmol/L (ref 98–111)
Creatinine, Ser: 1.96 mg/dL — ABNORMAL HIGH (ref 0.61–1.24)
GFR, Estimated: 32 mL/min — ABNORMAL LOW (ref >60)
Glucose, Bld: 106 mg/dL — ABNORMAL HIGH (ref 70–99)
Potassium: 3.8 mmol/L (ref 3.5–5.1)
Sodium: 132 mmol/L — ABNORMAL LOW (ref 135–145)
Total Bilirubin: 0.6 mg/dL (ref 0.3–1.2)
Total Protein: 5.9 g/dL — ABNORMAL LOW (ref 6.5–8.1)

## 2020-07-13 MED ORDER — METHYLPREDNISOLONE ACETATE 40 MG/ML IJ SUSP
40.0000 mg | INTRAMUSCULAR | Status: AC
  Administered 2020-07-13: 40 mg via INTRA_ARTICULAR
  Filled 2020-07-13: qty 1

## 2020-07-13 MED ORDER — FENTANYL CITRATE (PF) 100 MCG/2ML IJ SOLN
12.5000 ug | Freq: Once | INTRAMUSCULAR | Status: AC | PRN
  Administered 2020-07-13: 12.5 ug via INTRAVENOUS
  Filled 2020-07-13: qty 2

## 2020-07-13 MED ORDER — VITAMIN B-12 1000 MCG PO TABS
1000.0000 ug | ORAL_TABLET | Freq: Every day | ORAL | Status: DC
  Administered 2020-07-13 – 2020-07-20 (×8): 1000 ug via ORAL
  Filled 2020-07-13 (×8): qty 1

## 2020-07-13 MED ORDER — ONDANSETRON HCL 4 MG PO TABS: 4.0000 mg | ORAL_TABLET | Freq: Once | ORAL | Status: DC

## 2020-07-13 MED ORDER — BUPIVACAINE HCL (PF) 0.5 % IJ SOLN
5.0000 mL | INTRAMUSCULAR | Status: AC
  Administered 2020-07-13: 2 mL
  Filled 2020-07-13: qty 5

## 2020-07-13 MED ORDER — PROCHLORPERAZINE MALEATE 10 MG PO TABS
10.0000 mg | ORAL_TABLET | Freq: Four times a day (QID) | ORAL | Status: DC | PRN
  Administered 2020-07-15 – 2020-07-16 (×2): 10 mg via ORAL
  Filled 2020-07-13 (×4): qty 1

## 2020-07-13 MED ORDER — LIDOCAINE HCL (PF) 1 % IJ SOLN
5.0000 mL | INTRAMUSCULAR | Status: AC
  Administered 2020-07-13: 2 mL
  Filled 2020-07-13: qty 5

## 2020-07-13 MED ORDER — ONDANSETRON 4 MG PO TBDP
4.0000 mg | ORAL_TABLET | Freq: Once | ORAL | Status: AC
  Administered 2020-07-13: 4 mg via ORAL
  Filled 2020-07-13: qty 1

## 2020-07-13 MED ORDER — MONTELUKAST SODIUM 10 MG PO TABS
10.0000 mg | ORAL_TABLET | Freq: Every day | ORAL | Status: DC
  Administered 2020-07-13 – 2020-07-20 (×8): 10 mg via ORAL
  Filled 2020-07-13 (×9): qty 1

## 2020-07-13 NOTE — Progress Notes (Signed)
Orthopaedic Trauma Service Progress Note  Patient ID: Frank Avila MRN: [id number] DOB/AGE: 1928-04-20 87 y.o.  Subjective:  Still c/o R knee pain Analysis shows gout, no evidence of septic joint   L leg feels pretty good    ROS As above  Objective:   VITALS:   Vitals:   07/12/20 2100 07/13/20 0534 07/13/20 0822 07/13/20 1130  BP:  (!) 137/47 (!) 131/53 (!) 129/45  Pulse:  69 66 62  Resp: 20 13 18 18   Temp:  97.6 F (36.4 C) (!) 97.5 F (36.4 C) (!) 97.5 F (36.4 C)  TempSrc:  Oral Oral Oral  SpO2:  96% 98% 100%  Weight:      Height:        Estimated body mass index is 25.04 kg/m as calculated from the following:   Height as of this encounter: 5\' 8"  (1.727 m).   Weight as of this encounter: 74.7 kg.   Intake/Output      06/09 0701 06/10 0700 06/10 0701 06/11 0700   P.O. 240    Total Intake(mL/kg) 240 (3.2)    Urine (mL/kg/hr) 950 (0.5)    Total Output 950    Net -710           LABS  Results for orders placed or performed during the hospital encounter of 07/06/20 (from the past 24 hour(s))  CBC with Differential/Platelet     Status: Abnormal   Collection Time: 07/13/20  3:37 AM  Result Value Ref Range   WBC 10.6 (H) 4.0 - 10.5 K/uL   RBC 3.04 (L) 4.22 - 5.81 MIL/uL   Hemoglobin 9.5 (L) 13.0 - 17.0 g/dL   HCT 09/05/20 (L) 09/12/20 - 82.9 %   MCV 93.8 80.0 - 100.0 fL   MCH 31.3 26.0 - 34.0 pg   MCHC 33.3 30.0 - 36.0 g/dL   RDW 56.2 13.0 - 86.5 %   Platelets 332 150 - 400 K/uL   nRBC 0.0 0.0 - 0.2 %   Neutrophils Relative % 71 %   Neutro Abs 7.7 1.7 - 7.7 K/uL   Lymphocytes Relative 6 %   Lymphs Abs 0.6 (L) 0.7 - 4.0 K/uL   Monocytes Relative 16 %   Monocytes Absolute 1.7 (H) 0.1 - 1.0 K/uL   Eosinophils Relative 5 %   Eosinophils Absolute 0.5 0.0 - 0.5 K/uL   Basophils Relative 1 %   Basophils Absolute 0.1 0.0 - 0.1 K/uL   Immature Granulocytes 1 %   Abs Immature  Granulocytes 0.05 0.00 - 0.07 K/uL  Comprehensive metabolic panel     Status: Abnormal   Collection Time: 07/13/20  3:37 AM  Result Value Ref Range   Sodium 132 (L) 135 - 145 mmol/L   Potassium 3.8 3.5 - 5.1 mmol/L   Chloride 100 98 - 111 mmol/L   CO2 20 (L) 22 - 32 mmol/L   Glucose, Bld 106 (H) 70 - 99 mg/dL   BUN 37 (H) 8 - 23 mg/dL   Creatinine, Ser 69.6 (H) 0.61 - 1.24 mg/dL   Calcium 7.8 (L) 8.9 - 10.3 mg/dL   Total Protein 5.9 (L) 6.5 - 8.1 g/dL   Albumin 2.2 (L) 3.5 - 5.0 g/dL   AST 45 (H) 15 - 41 U/L   ALT 47 (H) 0 - 44 U/L  Alkaline Phosphatase 94 38 - 126 U/L   Total Bilirubin 0.6 0.3 - 1.2 mg/dL   GFR, Estimated 32 (L) >60 mL/min   Anion gap 12 5 - 15     PHYSICAL EXAM:   Gen: Sitting up in bed, appears to be resting comfortably Lungs: Unlabored Ext:       Left lower extremity             Splint is clean, dry and intact             Swelling is well controlled             No acute findings noted             Motor and sensory functions at baseline        right lower extremity             knee with minimal persistent effusion             No erythema             Moving knee without difficulty when asked but noticeably painful             Chronic muscle atrophy noted             Motor and sensory functions at baseline             No acute findings noted to the hip or the ankle             Extremity is warm             + DP pulse    Assessment/Plan:     Active Problems:   Symptomatic bradycardia   CAD (coronary artery disease) of artery bypass graft   Tremor   Leukocytosis   Acute gout of knee, Right   Closed fracture of shaft of left tibia   Anti-infectives (From admission, onward)    Start     Dose/Rate Route Frequency Ordered Stop   07/12/20 1000  sulfamethoxazole-trimethoprim (BACTRIM DS) 800-160 MG per tablet 1 tablet  Status:  Discontinued       Note to Pharmacy: Indication ? UTI--no culture to guide--u may adjust dose as you see fit--thanks    1 tablet Oral Daily 07/12/20 0834 07/12/20 0908   07/12/20 1000  sulfamethoxazole-trimethoprim (BACTRIM) 400-80 MG per tablet 1 tablet        1 tablet Oral Every 12 hours 07/12/20 0908     07/11/20 0330  cefTRIAXone (ROCEPHIN) 1 g in sodium chloride 0.9 % 100 mL IVPB  Status:  Discontinued        1 g 200 mL/hr over 30 Minutes Intravenous Every 24 hours 07/11/20 0241 07/12/20 0931     .  POD/HD#:   87 year old male syncopal episode admitted for cardiac event with left distal tibia fracture   -fall   -Left extra-articular distal tibia fracture, intact fibula             Continue nonoperative treatment left distal tibia fracture             Will convert to short leg cast prior to discharge             Continue with ice and elevation             Nonweightbearing left leg   -Right knee effusion with acute right knee pain, Acute gout flare R knee  s/p R knee arthrocentesis                         Fluid analysis consistent with acute gout                         Colchicine started yesterday                         If refractory to colchicine we can perform intraarticular steroid injection   Persistent R knee pain    Proceed with intra-articular steroid injection               WBAT R leg as needed to help with transfers             No ROM restrictions             Ice prn for symptom management              - Dispo:             Continue per cardiology and medicine service             Please let us know when the patient is expected to discharge and we can convert him to a short leg cast             Follow-up with orthopedics 2 weeks from discharge   Procedure note: Right knee steroid injection  Clinician: Mearl Latin, PA-C  Specimen: none   Complications: none  Intra-articular medications: 40 mg depo-medrol (1cc ), 2 cc 1% lidocaine, 2cc 0.5% marcaine   Description  Utilizing sterile technique the superolateral aspect of the Right knee was prepped with alcohol  swab, followed by betadine swabs x 3.  A 22 G needle on a 5cc syringe containing the solution above was advanced into the knee joint. A palpable pop was appreciated upon entrance to the knee.  Plunger withdrawn to confirm return of synovial fluid. Once confirmed the solution was injected into the joint. The solution was injected without any resistance.  Needle removed without difficulty, dressing applied.  Pt tolerated procedure well.     Pt has unrestricted ROM of R knee, recommend ice to the R knee for the next 24 hours   Mearl Latin, PA-C [phone] (C) 07/13/2020, 11:40 AM  [facility] [address] [place] [address] [phone] Carmon Ginsberg)      Mearl Latin, PA-C [phone] (C) 07/13/2020, 11:34 AM  [facility] [address] [place] [address] [phone] [phone] (F)    After 5pm and on the weekends please log on to Amion, go to orthopaedics and the look under the [facility] Call for the provider(s) on call. You can also call our office at [phone] and then follow the prompts to be connected to the call team.

## 2020-07-13 NOTE — Progress Notes (Signed)
OT Cancellation Note  Patient Details Name: Frank Avila MRN: [id number] DOB: 1928-08-27   Cancelled Treatment:    Reason Eval/Treat Not Completed: Medical issues which prohibited therapy. Per RN, pt's HR dropping into the 30s. Will hold OT for now.   Raynald Kemp, OT Acute Rehabilitation Services Pager: [phone] Office: [phone]  07/13/2020, 12:38 PM

## 2020-07-13 NOTE — Progress Notes (Signed)
Pt c/o nausea and feeling as if he needed to vomit. Pt has not eaten today except for one graham cracker earlier to help with nausea. Pt requested saltine cracker to see if it would help. Felt like he needed to vomit after eating cracker. HR dropped briefly to 37, pt diaphoretic and spit up cracker. MD called and immediately came and assessed pt. Pt wanted to eat breakfast as he felt this was issue. Currently eating scrambled eggs. Pt is aware of HR drop and states he would like to get a pacemaker since that is why he came to hospital. Pt resting with call bell within reach.  Will continue to monitor. Thomas Hoff, RN

## 2020-07-13 NOTE — Progress Notes (Signed)
PT Cancellation Note  Patient Details Name: Frank Avila MRN: [id number] DOB: Oct 16, 1928   Cancelled Treatment:    Reason Eval/Treat Not Completed: (P) Medical issues which prohibited therapy (Per RN, pt's HR dropping into the 30s. Will continue efforts per PT POC next date if medically appropriate as schedule permits.)   Angus Palms 07/13/2020, 12:42 PM

## 2020-07-13 NOTE — Progress Notes (Signed)
PROGRESS NOTE   Frank Avila  FXJ:[id number] DOB: [birth year] DOA: 07/06/2020 PCP: No primary care provider on file.  Brief Narrative:  87 year old veteran white male baseline wheelchair-bound CABG 1993, PCI 2012 followed by Dr. Vernona Rieger at [facility] Spinal muscular dystrophy HTN HLD Admit 07/06/2020 by cardiology secondary to LOC in the setting of recent addition of medication for tremors Found to be bradycardic rates in the 20s-Rx atropine by [facility] emergency room transvenous pacing wire placed Also found to have distal left tib-fib fracture  Cardiology consulted--bradycardia felt 2/2 propanolol Orthopedics consulted in addition Overnight 6/8 developed severe right knee pain--underwent arthrocentesis showing monosodium urate crystals  Patient again developed bradycardia a.m. 07/13/2020 prompting cardiology to be reconsulted with regards to need for pacemaker versus not   Hospital-Problem based course  Acute gout flare Colchicine no utility with regards to pain Appreciate orthopedics injecting knee with Depo-Medrol on 6/10 WBAT R leg Sonovial cultures 6/8 no growth-no antibiotics Chronic essential tremors Patient was being treated with propanolol which has been stopped secondary to the bradycardia Continue primidone low-dose 25 mg bradycardia Patient has again episodic bradycardia into the 30s prior to vasovagal event with nausea Appreciate cardiology input again with regards to?  PPM placement versus not Do not resume beta-blocker-marked as allergy Continue Plavix 75 mg Distal left tib-fib fracture Defer to orthopedics-tramadol for moderate pain-pain seems controlled at this time Nonweightbearing left leg Careful gabapentin-May need to cut back dose from 800 UTI? Follow BC x 2, follow UC Urine analysis suggestive of cystitis with leukocytes, nitrites Bladder scan performed by nursing 6/9 did not show any retention Flomax 0.4 daily Started limited course of Bactrim  6/9 ending on 6/12 HTN Amlodipine increased from 5-->10 qam. CABG CAD 1993 Plavix 75 daily, no BB Consider OP ARB AKI secondary to ATN poor p.o. intake Stop Monday Wednesday Friday Lasix 40 mg for now Azotemia slowly improving   DVT prophylaxis: SCD Code Status: FULL Family Communication: none presently at bedside Discussed with niece [name] [phone] Disposition:  Status is: Inpatient  Remains inpatient appropriate because:Hemodynamically unstable, Altered mental status and Unsafe d/c plan   Dispo: The patient is from: Home              Anticipated d/c is to: Home              Patient currently is not medically stable to d/c.   Difficult to place patient Yes  Consultants:  ORtho  Procedures:   Antimicrobials:     Subjective:  Nursing alerted me to the fact that patient had bradycardia this morning-this was prior to him feeling nauseated and having vomiting episodes I reviewed the telemetry personally which shows sinus bradycardia with less than 3-second pauses He had no chest pain at the time He feels somewhat better and was able to eat half a plate of eggs and breakfast like items  Objective: Vitals:   07/13/20 1245 07/13/20 1250 07/13/20 1300 07/13/20 1315  BP: (!) 134/56  (!) 143/48 (!) 146/54  Pulse: 67 67 (!) 34 (!) 58  Resp:  16 (!) 23 14  Temp: (!) 97.5 F (36.4 C)     TempSrc: Oral     SpO2: 99% 97% 98% 98%  Weight:      Height:        Intake/Output Summary (Last 24 hours) at 07/13/2020 1504 Last data filed at 07/13/2020 1311 Gross per 24 hour  Intake 540 ml  Output 800 ml  Net -260 ml  Filed Weights   07/08/20 0350 07/09/20 0600 07/11/20 0329  Weight: 77.5 kg 74.6 kg 74.7 kg    Examination:  No icterus no pallor-looks fair-younger than stated age S1-S2 no murmur telemetry as above-sinus bradycardia at times current rate 60s Abdomen soft no rebound no guarding ROM intact to hip-right lower extremity left lower extremity is bandaged and  I did not examine Patient's right knee is not red or warm Neurologically intact  Data Reviewed: personally reviewed   CBC    Component Value Date/Time   WBC 10.6 (H) 07/13/2020 0337   RBC 3.04 (L) 07/13/2020 0337   HGB 9.5 (L) 07/13/2020 0337   HCT 28.5 (L) 07/13/2020 0337   PLT 332 07/13/2020 0337   MCV 93.8 07/13/2020 0337   MCH 31.3 07/13/2020 0337   MCHC 33.3 07/13/2020 0337   RDW 12.3 07/13/2020 0337   LYMPHSABS 0.6 (L) 07/13/2020 0337   MONOABS 1.7 (H) 07/13/2020 0337   EOSABS 0.5 07/13/2020 0337   BASOSABS 0.1 07/13/2020 0337   CMP Latest Ref Rng & Units 07/13/2020 07/12/2020 07/10/2020  Glucose 70 - 99 mg/dL 923(R) 007(M) 226(J)  BUN 8 - 23 mg/dL 33(L) 45(G) 25(W)  Creatinine 0.61 - 1.24 mg/dL 3.89(H) 7.34(K) 8.76(O)  Sodium 135 - 145 mmol/L 132(L) 134(L) 132(L)  Potassium 3.5 - 5.1 mmol/L 3.8 4.2 4.0  Chloride 98 - 111 mmol/L 100 102 103  CO2 22 - 32 mmol/L 20(L) 20(L) 18(L)  Calcium 8.9 - 10.3 mg/dL 7.8(L) 8.1(L) 8.6(L)  Total Protein 6.5 - 8.1 g/dL 5.9(L) 5.9(L) -  Total Bilirubin 0.3 - 1.2 mg/dL 0.6 0.7 -  Alkaline Phos 38 - 126 U/L 94 72 -  AST 15 - 41 U/L 45(H) 23 -  ALT 0 - 44 U/L 47(H) 28 -    Radiology Studies: No results found.   Scheduled Meds:  amLODipine  10 mg Oral Daily   atorvastatin  20 mg Oral Daily   Chlorhexidine Gluconate Cloth  6 each Topical Daily   clopidogrel  75 mg Oral Daily   donepezil  10 mg Oral BID   gabapentin  800 mg Oral QPC supper   montelukast  10 mg Oral Daily   polyethylene glycol  17 g Oral Daily   primidone  25 mg Oral QHS   psyllium  1 packet Oral Daily   sodium chloride flush  3 mL Intravenous Q12H   sulfamethoxazole-trimethoprim  1 tablet Oral Q12H   tamsulosin  0.4 mg Oral QHS   vitamin B-12  1,000 mcg Oral Daily   Continuous Infusions:  sodium chloride       LOS: 7 days   Time spent: 74  Rhetta Mura, MD [facility] To contact the attending provider between 7A-7P or the covering provider  during after hours 7P-7A, please log into the web site [email or web address] and access using universal [place] password for that web site. If you do not have the password, please call the hospital operator.  07/13/2020, 3:04 PM

## 2020-07-13 NOTE — Progress Notes (Signed)
Attempted to give daily meds after meal and SL zofran. Pt said he would take but then stated he did not think he could keep them down. MD made aware. Pt resting with call bell within reach.  Will continue to monitor. Thomas Hoff, RN

## 2020-07-13 NOTE — Progress Notes (Addendum)
Progress Note  Patient Name: Frank Avila Date of Encounter: 07/13/2020  [facility] Cardiologist: Dr. Judithe Modest [facility])  Subjective   Pt has been nauseated all day  Inpatient Medications    Scheduled Meds:  amLODipine  10 mg Oral Daily   atorvastatin  20 mg Oral Daily   Chlorhexidine Gluconate Cloth  6 each Topical Daily   clopidogrel  75 mg Oral Daily   donepezil  10 mg Oral BID   gabapentin  800 mg Oral QPC supper   montelukast  10 mg Oral Daily   polyethylene glycol  17 g Oral Daily   primidone  25 mg Oral QHS   psyllium  1 packet Oral Daily   sodium chloride flush  3 mL Intravenous Q12H   sulfamethoxazole-trimethoprim  1 tablet Oral Q12H   tamsulosin  0.4 mg Oral QHS   vitamin B-12  1,000 mcg Oral Daily   Continuous Infusions:  sodium chloride     PRN Meds: sodium chloride, acetaminophen, lidocaine, melatonin, ondansetron (ZOFRAN) IV, senna-docusate, sodium chloride flush, traMADol   Vital Signs    Vitals:   07/13/20 1245 07/13/20 1250 07/13/20 1300 07/13/20 1315  BP: (!) 134/56  (!) 143/48 (!) 146/54  Pulse: 67 67 (!) 34 (!) 58  Resp:  16 (!) 23 14  Temp: (!) 97.5 F (36.4 C)     TempSrc: Oral     SpO2: 99% 97% 98% 98%  Weight:      Height:        Intake/Output Summary (Last 24 hours) at 07/13/2020 1352 Last data filed at 07/13/2020 1311 Gross per 24 hour  Intake 540 ml  Output 800 ml  Net -260 ml   Last 3 Weights 07/11/2020 07/09/2020 07/08/2020  Weight (lbs) 164 lb 10.9 oz 164 lb 7.4 oz 170 lb 13.7 oz  Weight (kg) 74.7 kg 74.6 kg 77.5 kg      Telemetry    Sinus, bouts of bigeminy, sinus bradycardia in the 30-40s both nocturnal and during waking hours - Personally Reviewed Lots of bigeminy   ECG    No  new tracings - Personally Reviewed  Physical Exam   GEN: No acute distress.   Neck: No JVD Cardiac: RRR, no murmurs, rubs, or gallops.  Respiratory: Clear to auscultation bilaterally. GI: Soft, nontender, non-distended  MS: No  edema Psych: Normal affect   Labs    High Sensitivity Troponin:  No results for input(s): TROPONINIHS in the last 720 hours.    Chemistry Recent Labs  Lab 07/06/20 2338 07/09/20 0245 07/10/20 0802 07/12/20 0134 07/13/20 0337  NA 130*   < > 132* 134* 132*  K 4.4   < > 4.0 4.2 3.8  CL 102   < > 103 102 100  CO2 19*   < > 18* 20* 20*  GLUCOSE 109*   < > 109* 114* 106*  BUN 34*   < > 25* 35* 37*  CREATININE 1.87*   < > 1.93* 2.13* 1.96*  CALCIUM 8.2*   < > 8.6* 8.1* 7.8*  PROT 6.3*  --   --  5.9* 5.9*  ALBUMIN 3.1*  --   --  2.3* 2.2*  AST 13*  --   --  23 45*  ALT 13  --   --  28 47*  ALKPHOS 63  --   --  72 94  BILITOT 0.9  --   --  0.7 0.6  GFRNONAA 34*   < > 32* 29* 32*  ANIONGAP 9   < > 11 12 12    < > = values in this interval not displayed.     Hematology Recent Labs  Lab 07/10/20 0802 07/12/20 0134 07/13/20 0337  WBC 11.0* 11.1* 10.6*  RBC 3.21* 3.14* 3.04*  HGB 10.3* 9.8* 9.5*  HCT 30.4* 29.7* 28.5*  MCV 94.7 94.6 93.8  MCH 32.1 31.2 31.3  MCHC 33.9 33.0 33.3  RDW 12.0 12.4 12.3  PLT 289 316 332    BNPNo results for input(s): BNP, PROBNP in the last 168 hours.   DDimer No results for input(s): DDIMER in the last 168 hours.   Radiology    No results found.  Cardiac Studies     Patient Profile     87 y.o. male with a history of HTN and tremor on propanolol. Pt admitted after a mechanical fall  resulting in a spiral fracture of left tibia. He was bradycardic and cardiology was consulted. Propranolol stopped with resolution of bradycardia. Cardiology asked to re-engage for recurrence of bradycardia.   Assessment & Plan    Sinus bradycardia Ventricular bigeminy Nausea - in speaking with the patient and RN, pt was nauseated this morning prior to daytime bradycardia - did have episodes of sinus bradycardia in the 30-40s durin sleeping and waking hours - he has continued to have nausea and one emesis but HR at those times have been in the  60s  It is difficult to ascertain if bradycardia was symptomatic, or if his bradycardia was due to vagal response to nausea. I'm not sure we can be certain this bradycardia caused his symptoms. Will touch base with our EP colleagues to discuss potential leadless PPM.       For questions or updates, please contact [facility] Please consult [email or web address] for contact info under        Signed, 95, PA  07/13/2020, 1:52 PM     Attending Note:   The patient was seen and examined.  Agree with assessment and plan as noted above.  Changes made to the above note as needed.  Patient seen and independently examined with 09/12/2020, PA .   We discussed all aspects of the encounter. I agree with the assessment and plan as stated above.   Sinus bradycardia: Patient has had persistent episodes of sinus bradycardia.  Of note is that he has been nauseated all day long.  It is difficult to say whether the nausea is calling his in his sinus bradycardia or vice versa. He was extremely nauseated when I went in to see him and his heart rate was only 60.  He is essentially bedbound/wheelchair-bound.  He has a neurologic deficit which produces profound weakness which has prevented him from walking for many years.  He has not had any syncope or presyncope and is likelihood of having syncope and falling is extremely low.  I have reviewed the case with the electrophysiology team and they do not think that he is a candidate for pacemaker at this time.   2.  Profound nausea: He has been getting Zofran all day long.  Dr. Bettina Gavia will try Compazine to see if it works any better.      I have spent a total of 40 minutes with patient reviewing hospital  notes , telemetry, EKGs, labs and examining patient as well as establishing an assessment and plan that was discussed with the patient.  > 50% of time was spent in direct patient care.    Mahala Menghini.  Fran Lowes., MD, [facility] 07/13/2020, 3:38 PM [address]  [address] Office [phone] Pager [phone]

## 2020-07-14 LAB — RENAL FUNCTION PANEL
Albumin: 2.2 g/dL — ABNORMAL LOW (ref 3.5–5.0)
Anion gap: 10 (ref 5–15)
BUN: 38 mg/dL — ABNORMAL HIGH (ref 8–23)
CO2: 20 mmol/L — ABNORMAL LOW (ref 22–32)
Calcium: 8.2 mg/dL — ABNORMAL LOW (ref 8.9–10.3)
Chloride: 100 mmol/L (ref 98–111)
Creatinine, Ser: 2 mg/dL — ABNORMAL HIGH (ref 0.61–1.24)
GFR, Estimated: 31 mL/min — ABNORMAL LOW (ref >60)
Glucose, Bld: 138 mg/dL — ABNORMAL HIGH (ref 70–99)
Phosphorus: 3.2 mg/dL (ref 2.5–4.6)
Potassium: 4.3 mmol/L (ref 3.5–5.1)
Sodium: 130 mmol/L — ABNORMAL LOW (ref 135–145)

## 2020-07-14 LAB — BODY FLUID CULTURE W GRAM STAIN: Culture: NO GROWTH

## 2020-07-14 NOTE — Progress Notes (Addendum)
PROGRESS NOTE   Frank Avila  WYO:[id number] DOB: 1928-05-25 DOA: 07/06/2020 PCP: No primary care provider on file.  Brief Narrative:  87 year old veteran white male baseline wheelchair-bound CABG 1993, PCI 2012 followed by Dr. Vernona Rieger at [facility] Spinal muscular dystrophy HTN HLD Admit 07/06/2020 by cardiology secondary to LOC in the setting of recent addition of medication for tremors Found to be bradycardic rates in the 20s-Rx atropine by [facility] emergency room transvenous pacing wire placed Also found to have distal left tib-fib fracture  Cardiology consulted--bradycardia felt 2/2 propanolol Orthopedics consulted in addition Overnight 6/8 developed severe right knee pain--underwent arthrocentesis showing monosodium urate crystals  Patient again developed bradycardia a.m. 07/13/2020 prompting cardiology to be reconsulted with regards to need for pacemaker versus not   Hospital-Problem based course   Persistent nausea vomiting 6/10 Intermittent with unclear cause Continue Compazine 10 every 6 as needed nausea  Add very low-dose of Reglan p.o. and see if this helps Acute gout flare Colchicine will be reattempted today as he is still having right knee pain Appreciate orthopedics injecting knee with Depo-Medrol on 6/10--WBAT R leg--pain is marginally improved Synovial cultures 6/8 no growth-no antibiotics Chronic essential tremors Patient was being treated with propanolol which has been stopped secondary to the bradycardia Continue primidone low-dose 25 mg bradycardia episodic bradycardia into the 30s prior to vasovagal event with nausea Cardiology less inclined to consider any type of PPM or invasive procedures-patient is asking specifically to speak to the cardiologist again Do not resume beta-blocker-marked as allergy Continue Plavix 75 mg Distal left tib-fib fracture Defer to orthopedics-tramadol for moderate pain-pain seems controlled at this  time Nonweightbearing left leg Careful gabapentin-continue 800 mg dosing  UTI? Follow BC x 2 blood culture X2 negative--Urine culture not performed --UA suggestive of cystitis Flomax 0.4 daily Bactrim DC 6/12 HTN Amlodipine increased from 5-->10 qam. CABG CAD 1993 Plavix 75 daily, no BB Consider OP ARB AKI secondary to ATN poor p.o. intake Stop Monday Wednesday Friday Lasix 40 mg for now Azotemia slowly improving improved do not resume Lasix unless there is a need   DVT prophylaxis: SCD Code Status: FULL Family Communication: none presently at bedside Discussed with niece [name] [phone] on 6/10 Disposition:  Status is: Inpatient  Remains inpatient appropriate because:Hemodynamically unstable, Altered mental status and Unsafe d/c plan   Dispo: The patient is from: Home              Anticipated d/c is to: Home              Patient currently is not medically stable to d/c.   Difficult to place patient Yes  Consultants:  ORtho  Procedures:   Antimicrobials:     Subjective:  overall better monitors are improved no chest pain no nausea still some knee pain on the right side but better does not seem swollen Asking specifically to speak to her cardiologist EH:[id number] implantation  Objective: Vitals:   07/13/20 2028 07/13/20 2327 07/14/20 0431 07/14/20 0850  BP: (!) 145/58 (!) 125/48 (!) 128/53 137/61  Pulse: 68 70 65 70  Resp: 19 13 20 20   Temp: 97.6 F (36.4 C) 97.9 F (36.6 C) 97.8 F (36.6 C) 98.2 F (36.8 C)  TempSrc: Oral Oral Oral Oral  SpO2: 97% 98% 93% 95%  Weight:      Height:        Intake/Output Summary (Last 24 hours) at 07/14/2020 1101 Last data filed at 07/14/2020 0655 Gross per 24 hour  Intake 640  ml  Output 1000 ml  Net -360 ml    Filed Weights   07/08/20 0350 07/09/20 0600 07/11/20 0329  Weight: 77.5 kg 74.6 kg 74.7 kg    Examination:  No icterus no pallor-looks fair-younger than stated age S1-S2 no murmur telemetry  Abdomen  soft no rebound no guarding ROM intact to hip-right lower extremity left lower extremity is bandaged and I did not examine Patient's right knee seems less red Neurologically intact  Data Reviewed: personally reviewed   CBC    Component Value Date/Time   WBC 10.6 (H) 07/13/2020 0337   RBC 3.04 (L) 07/13/2020 0337   HGB 9.5 (L) 07/13/2020 0337   HCT 28.5 (L) 07/13/2020 0337   PLT 332 07/13/2020 0337   MCV 93.8 07/13/2020 0337   MCH 31.3 07/13/2020 0337   MCHC 33.3 07/13/2020 0337   RDW 12.3 07/13/2020 0337   LYMPHSABS 0.6 (L) 07/13/2020 0337   MONOABS 1.7 (H) 07/13/2020 0337   EOSABS 0.5 07/13/2020 0337   BASOSABS 0.1 07/13/2020 0337   CMP Latest Ref Rng & Units 07/14/2020 07/13/2020 07/12/2020  Glucose 70 - 99 mg/dL 702(O) 378(H) 885(O)  BUN 8 - 23 mg/dL 27(X) 41(O) 87(O)  Creatinine 0.61 - 1.24 mg/dL 6.76(H) 2.09(O) 7.09(G)  Sodium 135 - 145 mmol/L 130(L) 132(L) 134(L)  Potassium 3.5 - 5.1 mmol/L 4.3 3.8 4.2  Chloride 98 - 111 mmol/L 100 100 102  CO2 22 - 32 mmol/L 20(L) 20(L) 20(L)  Calcium 8.9 - 10.3 mg/dL 8.2(L) 7.8(L) 8.1(L)  Total Protein 6.5 - 8.1 g/dL - 5.9(L) 5.9(L)  Total Bilirubin 0.3 - 1.2 mg/dL - 0.6 0.7  Alkaline Phos 38 - 126 U/L - 94 72  AST 15 - 41 U/L - 45(H) 23  ALT 0 - 44 U/L - 47(H) 28    Radiology Studies: No results found.   Scheduled Meds:  amLODipine  10 mg Oral Daily   atorvastatin  20 mg Oral Daily   Chlorhexidine Gluconate Cloth  6 each Topical Daily   clopidogrel  75 mg Oral Daily   donepezil  10 mg Oral BID   gabapentin  800 mg Oral QPC supper   montelukast  10 mg Oral Daily   polyethylene glycol  17 g Oral Daily   primidone  25 mg Oral QHS   psyllium  1 packet Oral Daily   sodium chloride flush  3 mL Intravenous Q12H   sulfamethoxazole-trimethoprim  1 tablet Oral Q12H   tamsulosin  0.4 mg Oral QHS   vitamin B-12  1,000 mcg Oral Daily   Continuous Infusions:  sodium chloride       LOS: 8 days   Time spent: 59  Rhetta Mura, MD [facility] To contact the attending provider between 7A-7P or the covering provider during after hours 7P-7A, please log into the web site [email or web address] and access using universal [place] password for that web site. If you do not have the password, please call the hospital operator.  07/14/2020, 11:01 AM

## 2020-07-15 LAB — CBC WITH DIFFERENTIAL/PLATELET
Abs Immature Granulocytes: 0.05 10*3/uL (ref 0.00–0.07)
Basophils Absolute: 0 10*3/uL (ref 0.0–0.1)
Basophils Relative: 0 %
Eosinophils Absolute: 0 10*3/uL (ref 0.0–0.5)
Eosinophils Relative: 0 %
HCT: 27.5 % — ABNORMAL LOW (ref 39.0–52.0)
Hemoglobin: 9.2 g/dL — ABNORMAL LOW (ref 13.0–17.0)
Immature Granulocytes: 1 %
Lymphocytes Relative: 7 %
Lymphs Abs: 0.5 10*3/uL — ABNORMAL LOW (ref 0.7–4.0)
MCH: 31.3 pg (ref 26.0–34.0)
MCHC: 33.5 g/dL (ref 30.0–36.0)
MCV: 93.5 fL (ref 80.0–100.0)
Monocytes Absolute: 0.7 10*3/uL (ref 0.1–1.0)
Monocytes Relative: 9 %
Neutro Abs: 6.6 10*3/uL (ref 1.7–7.7)
Neutrophils Relative %: 83 %
Platelets: 405 10*3/uL — ABNORMAL HIGH (ref 150–400)
RBC: 2.94 MIL/uL — ABNORMAL LOW (ref 4.22–5.81)
RDW: 12.1 % (ref 11.5–15.5)
WBC: 7.9 10*3/uL (ref 4.0–10.5)
nRBC: 0 % (ref 0.0–0.2)

## 2020-07-15 LAB — CULTURE, BLOOD (ROUTINE X 2)
Culture: NO GROWTH
Special Requests: ADEQUATE

## 2020-07-15 LAB — RENAL FUNCTION PANEL
Albumin: 2.1 g/dL — ABNORMAL LOW (ref 3.5–5.0)
Anion gap: 8 (ref 5–15)
BUN: 45 mg/dL — ABNORMAL HIGH (ref 8–23)
CO2: 21 mmol/L — ABNORMAL LOW (ref 22–32)
Calcium: 7.9 mg/dL — ABNORMAL LOW (ref 8.9–10.3)
Chloride: 101 mmol/L (ref 98–111)
Creatinine, Ser: 2.18 mg/dL — ABNORMAL HIGH (ref 0.61–1.24)
GFR, Estimated: 28 mL/min — ABNORMAL LOW (ref >60)
Glucose, Bld: 121 mg/dL — ABNORMAL HIGH (ref 70–99)
Phosphorus: 3.3 mg/dL (ref 2.5–4.6)
Potassium: 4.6 mmol/L (ref 3.5–5.1)
Sodium: 130 mmol/L — ABNORMAL LOW (ref 135–145)

## 2020-07-15 MED ORDER — COLCHICINE 0.6 MG PO TABS
0.6000 mg | ORAL_TABLET | Freq: Every day | ORAL | Status: DC
  Administered 2020-07-15 – 2020-07-20 (×6): 0.6 mg via ORAL
  Filled 2020-07-15 (×6): qty 1

## 2020-07-15 MED ORDER — METOCLOPRAMIDE HCL 5 MG PO TABS: 5.0000 mg | ORAL_TABLET | Freq: Three times a day (TID) | ORAL | Status: DC

## 2020-07-15 MED ORDER — METOCLOPRAMIDE HCL 5 MG PO TABS
5.0000 mg | ORAL_TABLET | Freq: Four times a day (QID) | ORAL | Status: DC | PRN
  Administered 2020-07-16: 5 mg via ORAL
  Filled 2020-07-15: qty 1

## 2020-07-15 NOTE — Progress Notes (Signed)
Progress Note  Patient Name: Frank Avila Date of Encounter: 07/15/2020  [facility] Cardiologist: Dr. Judithe Modest [facility])  Subjective  Asked to see today by [facility] due to patient adamant he wants a PPM.  Denies any chest pain or SOB.  Dr. Elease Hashimoto and I discussed with EP and no pacer indicated at this time but patient is very unhappy.   Inpatient Medications    Scheduled Meds:  amLODipine  10 mg Oral Daily   atorvastatin  20 mg Oral Daily   Chlorhexidine Gluconate Cloth  6 each Topical Daily   clopidogrel  75 mg Oral Daily   donepezil  10 mg Oral BID   gabapentin  800 mg Oral QPC supper   montelukast  10 mg Oral Daily   polyethylene glycol  17 g Oral Daily   primidone  25 mg Oral QHS   psyllium  1 packet Oral Daily   sodium chloride flush  3 mL Intravenous Q12H   sulfamethoxazole-trimethoprim  1 tablet Oral Q12H   tamsulosin  0.4 mg Oral QHS   vitamin B-12  1,000 mcg Oral Daily   Continuous Infusions:  sodium chloride     PRN Meds: sodium chloride, acetaminophen, lidocaine, melatonin, prochlorperazine, senna-docusate, sodium chloride flush, traMADol   Vital Signs    Vitals:   07/14/20 2115 07/15/20 0015 07/15/20 0507 07/15/20 0853  BP: (!) 141/59 138/64 (!) 129/57 (!) 142/63  Pulse: 70 75  68  Resp:  19 18 16   Temp: 98.6 F (37 C) 98.5 F (36.9 C) 98.5 F (36.9 C) 98.1 F (36.7 C)  TempSrc: Oral Oral Oral Oral  SpO2: 98% 95% 98% 97%  Weight:      Height:        Intake/Output Summary (Last 24 hours) at 07/15/2020 09/14/2020 Last data filed at 07/14/2020 1743 Gross per 24 hour  Intake 200 ml  Output 550 ml  Net -350 ml    Last 3 Weights 07/11/2020 07/09/2020 07/08/2020  Weight (lbs) 164 lb 10.9 oz 164 lb 7.4 oz 170 lb 13.7 oz  Weight (kg) 74.7 kg 74.6 kg 77.5 kg      Telemetry    NSR- Personally Reviewed   ECG    NO new EKG to review- Personally Reviewed  Physical Exam   GEN: Well nourished, well developed in no acute distress HEENT: Normal NECK: No JVD; No  carotid bruits LYMPHATICS: No lymphadenopathy CARDIAC:RRR, no murmurs, rubs, gallops RESPIRATORY:  Clear to auscultation without rales, wheezing or rhonchi  ABDOMEN: Soft, non-tender, non-distended MUSCULOSKELETAL:  No edema; No deformity  SKIN: Warm and dry NEUROLOGIC:  Alert and oriented x 3 PSYCHIATRIC:  Normal affect    Labs    High Sensitivity Troponin:  No results for input(s): TROPONINIHS in the last 720 hours.    Chemistry Recent Labs  Lab 07/12/20 0134 07/13/20 0337 07/14/20 0938 07/15/20 0042  NA 134* 132* 130* 130*  K 4.2 3.8 4.3 4.6  CL 102 100 100 101  CO2 20* 20* 20* 21*  GLUCOSE 114* 106* 138* 121*  BUN 35* 37* 38* 45*  CREATININE 2.13* 1.96* 2.00* 2.18*  CALCIUM 8.1* 7.8* 8.2* 7.9*  PROT 5.9* 5.9*  --   --   ALBUMIN 2.3* 2.2* 2.2* 2.1*  AST 23 45*  --   --   ALT 28 47*  --   --   ALKPHOS 72 94  --   --   BILITOT 0.7 0.6  --   --   GFRNONAA 29* 32*  31* 28*  ANIONGAP 12 12 10 8       Hematology Recent Labs  Lab 07/12/20 0134 07/13/20 0337 07/15/20 0042  WBC 11.1* 10.6* 7.9  RBC 3.14* 3.04* 2.94*  HGB 9.8* 9.5* 9.2*  HCT 29.7* 28.5* 27.5*  MCV 94.6 93.8 93.5  MCH 31.2 31.3 31.3  MCHC 33.0 33.3 33.5  RDW 12.4 12.3 12.1  PLT 316 332 405*     BNPNo results for input(s): BNP, PROBNP in the last 168 hours.   DDimer No results for input(s): DDIMER in the last 168 hours.   Radiology    No results found.  Cardiac Studies  2D echo 07/2020 IMPRESSIONS    1. Left ventricular ejection fraction, by estimation, is 60 to 65%. The  left ventricle has normal function. The left ventricle has no regional  wall motion abnormalities. Left ventricular diastolic parameters are  consistent with Grade II diastolic  dysfunction (pseudonormalization). Elevated left atrial pressure.   2. Right ventricular systolic function is normal. The right ventricular  size is normal.   3. The mitral valve is normal in structure. No evidence of mitral valve   regurgitation. No evidence of mitral stenosis.   4. The aortic valve is normal in structure. Aortic valve regurgitation is  not visualized. Mild aortic valve sclerosis is present, with no evidence  of aortic valve stenosis.   5. The inferior vena cava is normal in size with greater than 50%  respiratory variability, suggesting right atrial pressure of 3 mmHg.   Patient Profile     87 y.o. male with a history of HTN and tremor on propanolol. Pt admitted after a mechanical fall  resulting in a spiral fracture of left tibia. He was bradycardic and cardiology was consulted. Propranolol stopped with resolution of bradycardia. Cardiology asked to re-engage for recurrence of bradycardia.   Assessment & Plan    Sinus bradycardia Ventricular bigeminy Nausea - pt was nauseated the morning prior to daytime bradycardia - did have episodes of sinus bradycardia in the 30-40s during sleeping and waking hours - he tells me that he has had several episodes of profound nausea at home and had a syncopal episode and is convinced that the nausea is because he is bradycardic at the time - no further episodes of bradycardia noted on tele - He is essentially bedbound/wheelchair-bound.  He has a neurologic deficit which produces profound weakness which has prevented him from walking for many years.  He has not had any syncope or presyncope and is likelihood of having syncope and falling is extremely low. - Dr. 95 talked with EP and I reconfirmed with EP yesterday that he is not a candidate for PPM at this time but patient is very adamant that he needs a pacer and that the bradycardia is causing his nausea.  He has not had any further nausea and no further bradyarrythmias on tele -he is adamant that he wants to talk to EP so will have them see him today for formal consult     For questions or updates, please contact [facility] Please consult [email or web address] for contact info under        Signed, Elease Hashimoto, MD  07/15/2020, 9:05 AM

## 2020-07-15 NOTE — Progress Notes (Signed)
PROGRESS NOTE   Frank Avila  TDS:[id number] DOB: [birth year] DOA: 07/06/2020 PCP: No primary care provider on file.  Brief Narrative:  87 year old veteran white male baseline wheelchair-bound CABG 1993, PCI 2012 followed by Dr. Vernona Avila at [facility] Spinal muscular dystrophy HTN HLD Admit 07/06/2020 by cardiology secondary to LOC in the setting of recent addition of medication for tremors Found to be bradycardic rates in the 20s-Rx atropine by [facility] emergency room transvenous pacing wire placed Also found to have distal left tib-fib fracture  Cardiology consulted--bradycardia felt 2/2 propanolol Orthopedics consulted in addition Overnight 6/8 developed severe right knee pain--underwent arthrocentesis showing monosodium urate crystals  Patient again developed bradycardia a.m. 07/13/2020 prompting cardiology to be reconsulted with regards to need for pacemaker versus not   Hospital-Problem based course   Persistent nausea vomiting 6/10 Intermittent with unclear cause Continue Compazine 10 every 6 as needed nausea   Reglan as needed ordered to see if helps Acute gout flare Colchicine will be reattempted today as he is still having right knee pain WBAT R leg--status post steroid injection 6/8/orthopedics Synovial cultures 6/8 no growth-no antibiotics Chronic essential tremors Patient was being treated with propanolol which has been stopped secondary to the bradycardia Continue primidone low-dose 25 mg bradycardia episodic bradycardia into the 30s prior to vasovagal event with nausea-EP to talk to patient about feasibility of PPM Do not resume beta-blocker-marked as allergy Continue Plavix 75 mg Distal left tib-fib fracture Defer to orthopedics-tramadol for moderate pain-pain seems controlled at this time Nonweightbearing left leg Careful gabapentin-continue 800 mg dosing  UTI? Follow BC x 2 blood culture X2 negative--Urine culture not performed --UA suggestive of  cystitis Flomax 0.4 daily Bactrim DC 6/12 HTN Amlodipine increased from 5-->10 qam. CABG CAD 1993 Plavix 75 daily, no BB Consider OP ARB AKI secondary to ATN poor p.o. intake Stop Monday Wednesday Friday Lasix 40 mg for now Azotemia slowly improving improved do not resume Lasix unless there is a need   DVT prophylaxis: SCD Code Status: FULL Family Communication: none presently at bedside Discussed with niece Frank Avila [phone] on 6/10 I called to give her a brief update on 6/12 but did not reach her and left a voicemail Disposition:  Status is: Inpatient  Remains inpatient appropriate because:Hemodynamically unstable, Altered mental status and Unsafe d/c plan   Dispo: The patient is from: Home              Anticipated d/c is to: Home              Patient currently is not medically stable to d/c.   Difficult to place patient Yes  Consultants:  Ortho cardiology  Procedures:   Antimicrobials:     Subjective:  overall better monitors are improved no chest pain no nausea still some knee pain on the right side but better does not seem swollen Asking specifically to speak to her cardiologist BU:LAGTXMIWO implantation  Objective: Vitals:   07/15/20 0015 07/15/20 0507 07/15/20 0853 07/15/20 1147  BP: 138/64 (!) 129/57 (!) 142/63 (!) 134/56  Pulse: 75  68 67  Resp: 19 18 16 18   Temp: 98.5 F (36.9 C) 98.5 F (36.9 C) 98.1 F (36.7 C) 97.7 F (36.5 C)  TempSrc: Oral Oral Oral Oral  SpO2: 95% 98% 97% 99%  Weight:      Height:        Intake/Output Summary (Last 24 hours) at 07/15/2020 1541 Last data filed at 07/15/2020 1500 Gross per 24 hour  Intake 236 ml  Output 1700 ml  Net -1464 ml    Filed Weights   07/08/20 0350 07/09/20 0600 07/11/20 0329  Weight: 77.5 kg 74.6 kg 74.7 kg    Examination:  No icterus no pallor-looks fair-younger than stated age S1-S2 no murmur telemetry  Abdomen soft no rebound no guarding ROM intact to hip Right lower extremity ROM  is diminished somewhat secondary to pain I do not appreciate joint line tenderness left lower extremity is wrapped  Data Reviewed: personally reviewed   CBC    Component Value Date/Time   WBC 7.9 07/15/2020 0042   RBC 2.94 (L) 07/15/2020 0042   HGB 9.2 (L) 07/15/2020 0042   HCT 27.5 (L) 07/15/2020 0042   PLT 405 (H) 07/15/2020 0042   MCV 93.5 07/15/2020 0042   MCH 31.3 07/15/2020 0042   MCHC 33.5 07/15/2020 0042   RDW 12.1 07/15/2020 0042   LYMPHSABS 0.5 (L) 07/15/2020 0042   MONOABS 0.7 07/15/2020 0042   EOSABS 0.0 07/15/2020 0042   BASOSABS 0.0 07/15/2020 0042   CMP Latest Ref Rng & Units 07/15/2020 07/14/2020 07/13/2020  Glucose 70 - 99 mg/dL 161(W) 960(A) 540(J)  BUN 8 - 23 mg/dL 81(X) 91(Y) 78(G)  Creatinine 0.61 - 1.24 mg/dL 9.56(O) 1.30(Q) 6.57(Q)  Sodium 135 - 145 mmol/L 130(L) 130(L) 132(L)  Potassium 3.5 - 5.1 mmol/L 4.6 4.3 3.8  Chloride 98 - 111 mmol/L 101 100 100  CO2 22 - 32 mmol/L 21(L) 20(L) 20(L)  Calcium 8.9 - 10.3 mg/dL 7.9(L) 8.2(L) 7.8(L)  Total Protein 6.5 - 8.1 g/dL - - 5.9(L)  Total Bilirubin 0.3 - 1.2 mg/dL - - 0.6  Alkaline Phos 38 - 126 U/L - - 94  AST 15 - 41 U/L - - 45(H)  ALT 0 - 44 U/L - - 47(H)    Radiology Studies: No results found.   Scheduled Meds:  amLODipine  10 mg Oral Daily   atorvastatin  20 mg Oral Daily   Chlorhexidine Gluconate Cloth  6 each Topical Daily   clopidogrel  75 mg Oral Daily   colchicine  0.6 mg Oral Daily   donepezil  10 mg Oral BID   gabapentin  800 mg Oral QPC supper   metoCLOPramide  5 mg Oral TID AC   montelukast  10 mg Oral Daily   polyethylene glycol  17 g Oral Daily   primidone  25 mg Oral QHS   psyllium  1 packet Oral Daily   sodium chloride flush  3 mL Intravenous Q12H   sulfamethoxazole-trimethoprim  1 tablet Oral Q12H   tamsulosin  0.4 mg Oral QHS   vitamin B-12  1,000 mcg Oral Daily   Continuous Infusions:  sodium chloride       LOS: 9 days   Time spent: 29  Rhetta Mura,  MD [facility] To contact the attending provider between 7A-7P or the covering provider during after hours 7P-7A, please log into the web site [email or web address] and access using universal [place] password for that web site. If you do not have the password, please call the hospital operator.  07/15/2020, 3:41 PM

## 2020-07-16 LAB — GLUCOSE, CAPILLARY
Glucose-Capillary: 95 mg/dL (ref 70–99)
Glucose-Capillary: 103 mg/dL — ABNORMAL HIGH (ref 70–99)
Glucose-Capillary: 149 mg/dL — ABNORMAL HIGH (ref 70–99)

## 2020-07-16 LAB — ANAEROBIC CULTURE W GRAM STAIN: Gram Stain: NONE SEEN

## 2020-07-16 LAB — SARS CORONAVIRUS 2 (TAT 6-24 HRS): SARS Coronavirus 2: NEGATIVE

## 2020-07-16 NOTE — Progress Notes (Signed)
Physical Therapy Treatment Patient Details Name: Frank Avila MRN: [id number] DOB: 21-Nov-1928 Today's Date: 07/16/2020    History of Present Illness 87 yo admitted 6/3 after syncope while in [facility] which pt slid out of WC getting his LLE caught. He was bradycardic with 2nd degree AV block resolved with medication. pt found to have LLE tibia fx treated non operatively. PMhx: HTN, HLD, CAD s/p CABG, spinal muscular dystrophy (diagnosed when he was 87 y/o).    PT Comments    Pt received in supine, agreeable to therapy session and with good participation and tolerance for bed mobility and transfer training. Pt with improved trunk control/seated balance this date compared with previous session and able to initiate seated scoot transfer training as well as seated lateral leans with elbow taps and RLE exercises. Pt c/o nausea and significant fatigue after seated scooting ~41ft total (to foot of bed/HOB) and deferred OOB transfer to chair, will plan to assess slide board transfer to drop arm recliner next date. Pt defers anti-nausea meds as sxs resolved with return to supine. Pt continues to benefit from PT services to progress toward functional mobility goals. Continue to recommend SNF.   Follow Up Recommendations  SNF     Equipment Recommendations  None recommended by PT    Recommendations for Other Services       Precautions / Restrictions Precautions Precautions: Fall Precaution Comments: per pt no muscular control in LLE and foot drop in RLE, sometimes poor trunk control Restrictions Weight Bearing Restrictions: Yes RLE Weight Bearing: Weight bearing as tolerated LLE Weight Bearing: Non weight bearing    Mobility  Bed Mobility Overal bed mobility: Needs Assistance Bed Mobility: Supine to Sit;Sit to Supine     Supine to sit: HOB elevated;Mod assist;+2 for physical assistance Sit to supine: Max assist;+2 for physical assistance;+2 for safety/equipment   General bed mobility  comments: via log roll; does better with single step cues    Transfers                Lateral/Scoot Transfers: Min assist;+2 physical assistance;+2 safety/equipment General transfer comment: pt performed lateral scooting toward HOB then foot of bed/HOB ~74ft each direction (~69ft total) with minA transfer pad assist and second person behind pt for safety due to poor trunk control previous date, but no posterior LOB while scooting this date.  Ambulation/Gait                 Stairs             Wheelchair Mobility    Modified Rankin (Stroke Patients Only)       Balance Overall balance assessment: Needs assistance Sitting-balance support: Bilateral upper extremity supported;Feet supported Sitting balance-Leahy Scale: Poor Sitting balance - Comments: reliant on UE support EOB intermittent progression to one UE support Postural control: Posterior lean                                  Cognition Arousal/Alertness: Awake/alert Behavior During Therapy: WFL for tasks assessed/performed Overall Cognitive Status: Impaired/Different from baseline Area of Impairment: Safety/judgement;Following commands;Attention                   Current Attention Level: Selective   Following Commands: Follows one step commands with increased time;Follows one step commands consistently Safety/Judgement: Decreased awareness of safety;Decreased awareness of deficits     General Comments: Pt with improving insight into deficits, improved attention to task  this date; pleasant and participatory - follows all commands (increased time or repetition due to [facility])      Exercises General Exercises - Lower Extremity Long Arc Quad: AAROM;Right;10 reps;Seated Hip Flexion/Marching:  (pt reports he is unable at baseline)    General Comments General comments (skin integrity, edema, etc.): BP 143/59 (83) supine ; BP 139/69 (89) seated EOB c/o nausea; HR 69 bpm and SpO2 99% on RA  upon arrival and WNL throughout      Pertinent Vitals/Pain Pain Assessment: Faces Faces Pain Scale: Hurts a little bit Pain Location: R knee Pain Descriptors / Indicators: Sore Pain Intervention(s): Limited activity within patient's tolerance;Monitored during session;Repositioned    Home Living                      Prior Function            PT Goals (current goals can now be found in the care plan section) Acute Rehab PT Goals Patient Stated Goal: go to [facility] for rehab then return home PT Goal Formulation: With patient Time For Goal Achievement: 07/23/20 Potential to Achieve Goals: Fair Progress towards PT goals: Progressing toward goals    Frequency    Min 3X/week      PT Plan Current plan remains appropriate    Co-evaluation              AM-PAC PT "6 Clicks" Mobility   Outcome Measure  Help needed turning from your back to your side while in a flat bed without using bedrails?: A Lot Help needed moving from lying on your back to sitting on the side of a flat bed without using bedrails?: A Lot Help needed moving to and from a bed to a chair (including a wheelchair)?: A Lot Help needed standing up from a chair using your arms (e.g., wheelchair or bedside chair)?: Total Help needed to walk in hospital room?: Total Help needed climbing 3-5 steps with a railing? : Total 6 Click Score: 9    End of Session Equipment Utilized During Treatment: Other (comment) (transfer pads) Activity Tolerance: Patient tolerated treatment well Patient left: in bed;with call bell/phone within reach;with bed alarm set;Other (comment) (heels floated, pillows under BUE, pt deferred pillow under L/R hip to offload) Nurse Communication: Mobility status PT Visit Diagnosis: Other abnormalities of gait and mobility (R26.89);Muscle weakness (generalized) (M62.81)     Time: 9937-1696 PT Time Calculation (min) (ACUTE ONLY): 30 min  Charges:  $Therapeutic Exercise: 8-22  mins $Therapeutic Activity: 8-22 mins                     Kier Smead P., PTA Acute Rehabilitation Services Pager: [phone] Office: [phone]    Dorathy Kinsman Anaeli Cornwall 07/16/2020, 6:01 PM

## 2020-07-16 NOTE — TOC Progression Note (Signed)
Transition of Care [facility]) - Progression Note    Patient Details  Name: Frank Avila MRN: [id number] Date of Birth: 03/10/1928  [facility] CM/SW Contact  Eduard Roux, [place] Phone Number: 07/16/2020, 12:58 PM  Clinical Narrative:     Scherry Ran- no answer, left voice message   Antony Blackbird, MSW, LCSW Clinical Social Worker    Expected Discharge Plan: Skilled Nursing Facility Barriers to Discharge: Continued Medical Work up  Expected Discharge Plan and Services Expected Discharge Plan: Skilled Nursing Facility In-house Referral: Clinical Social Work     Living arrangements for the past 2 months: Single Family Home                                       Social Determinants of Health (SDOH) Interventions    Readmission Risk Interventions No flowsheet data found.

## 2020-07-16 NOTE — Progress Notes (Signed)
PROGRESS NOTE   Frank Avila  KDX:[id number] DOB: 1928-06-12 DOA: 07/06/2020 PCP: No primary care provider on file.  Brief Narrative:  87 year old veteran white male baseline wheelchair-bound CABG 1993, PCI 2012 followed by Dr. Vernona Rieger at [facility] Spinal muscular dystrophy HTN HLD Admit 07/06/2020 by cardiology secondary to LOC in the setting of recent addition of medication for tremors Found to be bradycardic rates in the 20s-Rx atropine by [facility] emergency room transvenous pacing wire placed Also found to have distal left tib-fib fracture  Cardiology consulted--bradycardia felt 2/2 propanolol Orthopedics consulted in addition Overnight 6/8 developed severe right knee pain--underwent arthrocentesis showing monosodium urate crystals  Patient again developed bradycardia a.m. 07/13/2020 prompting reconsult from cardiology EP to see 6/13 to discussed with the patient   Hospital-Problem based course   Persistent nausea vomiting 6/10 Intermittent with unclear cause Continue Compazine 10 every 6 as needed nausea  Reglan as needed ordered to see if helps Acute gout flare R Kne Colchicine reattempted still having right knee pain WBAT R leg--status post steroid injection 6/8/orthopedics Synovial cultures 6/8 no growth-no antibiotics Chronic essential tremors Patient was being treated with propanolol which has been stopped secondary to the bradycardia Continue primidone low-dose 25 mg bradycardia episodic bradycardia into the 30s prior to vasovagal event with nausea- EP consulted 6/13  feel event monitor most appropriate action at this time Do not resume beta-blocker-marked as allergy Continue Plavix 75 mg Distal left tib-fib fracture LLE Defer to orthopedics-tramadol for moderate pain- seems controlled  Nonweightbearing left leg Careful gabapentin-continue 800 mg dosing  UTI? Follow BC x 2 blood culture X2 negative--Urine culture not performed --UA suggestive of  cystitis Flomax 0.4 daily Bactrim DC 6/12 with no recurrence of fever HTN Amlodipine increased from 5-->10 qam. CABG CAD 1993 Plavix 75 daily, no BB Consider OP ARB AKI superimposed on CKD 3B Stop Monday Wednesday Friday Lasix 40 mg for now  DVT prophylaxis: SCD Code Status: FULL Family Communication: none presently at bedside Discussed with niece [name] [phone] on 6/10 Updated on 6/13 Likely SNF in 24 hours Disposition:  Status is: Inpatient  Remains inpatient appropriate because:Hemodynamically unstable, Altered mental status and Unsafe d/c plan   Dispo: The patient is from: Home              Anticipated d/c is to: Home              Patient currently is not medically stable to d/c.   Difficult to place patient Yes  Consultants:  Ortho cardiology  Procedures:   Antimicrobials:     Subjective:  Awake coherent no distress Eating no distress no n/v No cp  Objective: Vitals:   07/15/20 1542 07/15/20 1900 07/15/20 2309 07/16/20 0348  BP: (!) 150/61 (!) 140/55 (!) 143/69 (!) 147/62  Pulse: 69 76 77 69  Resp: 17 18 19 20   Temp: 98.7 F (37.1 C) 98.4 F (36.9 C) 98 F (36.7 C) 98.1 F (36.7 C)  TempSrc: Oral Oral Oral Oral  SpO2: 93% 98% 97% 95%  Weight:      Height:        Intake/Output Summary (Last 24 hours) at 07/16/2020 0719 Last data filed at 07/15/2020 1856 Gross per 24 hour  Intake 1209 ml  Output 1400 ml  Net -191 ml    Filed Weights   07/08/20 0350 07/09/20 0600 07/11/20 0329  Weight: 77.5 kg 74.6 kg 74.7 kg    Examination:  Awake coherent no distress Chest clear no rales no rhonchi  S1-S2 no murmur no rub no gallop--telemetry reviewed not showing any arrhythmias today Abdomen soft no rebound no guarding Neurologically intact moving 4 limbs equally  Data Reviewed: personally reviewed   CBC    Component Value Date/Time   WBC 7.9 07/15/2020 0042   RBC 2.94 (L) 07/15/2020 0042   HGB 9.2 (L) 07/15/2020 0042   HCT 27.5 (L)  07/15/2020 0042   PLT 405 (H) 07/15/2020 0042   MCV 93.5 07/15/2020 0042   MCH 31.3 07/15/2020 0042   MCHC 33.5 07/15/2020 0042   RDW 12.1 07/15/2020 0042   LYMPHSABS 0.5 (L) 07/15/2020 0042   MONOABS 0.7 07/15/2020 0042   EOSABS 0.0 07/15/2020 0042   BASOSABS 0.0 07/15/2020 0042   CMP Latest Ref Rng & Units 07/15/2020 07/14/2020 07/13/2020  Glucose 70 - 99 mg/dL 545(G) 256(L) 893(T)  BUN 8 - 23 mg/dL 34(K) 87(G) 81(L)  Creatinine 0.61 - 1.24 mg/dL 5.72(I) 2.03(T) 5.97(C)  Sodium 135 - 145 mmol/L 130(L) 130(L) 132(L)  Potassium 3.5 - 5.1 mmol/L 4.6 4.3 3.8  Chloride 98 - 111 mmol/L 101 100 100  CO2 22 - 32 mmol/L 21(L) 20(L) 20(L)  Calcium 8.9 - 10.3 mg/dL 7.9(L) 8.2(L) 7.8(L)  Total Protein 6.5 - 8.1 g/dL - - 5.9(L)  Total Bilirubin 0.3 - 1.2 mg/dL - - 0.6  Alkaline Phos 38 - 126 U/L - - 94  AST 15 - 41 U/L - - 45(H)  ALT 0 - 44 U/L - - 47(H)    Radiology Studies: No results found.   Scheduled Meds:  amLODipine  10 mg Oral Daily   atorvastatin  20 mg Oral Daily   Chlorhexidine Gluconate Cloth  6 each Topical Daily   clopidogrel  75 mg Oral Daily   colchicine  0.6 mg Oral Daily   donepezil  10 mg Oral BID   gabapentin  800 mg Oral QPC supper   montelukast  10 mg Oral Daily   polyethylene glycol  17 g Oral Daily   primidone  25 mg Oral QHS   psyllium  1 packet Oral Daily   sodium chloride flush  3 mL Intravenous Q12H   tamsulosin  0.4 mg Oral QHS   vitamin B-12  1,000 mcg Oral Daily   Continuous Infusions:  sodium chloride       LOS: 10 days   Time spent: 1  Rhetta Mura, MD [facility] To contact the attending provider between 7A-7P or the covering provider during after hours 7P-7A, please log into the web site [email or web address] and access using universal [place] password for that web site. If you do not have the password, please call the hospital operator.  07/16/2020, 7:19 AM

## 2020-07-16 NOTE — Progress Notes (Signed)
Occupational Therapy Treatment Patient Details Name: Frank Avila MRN: [id number] DOB: 03-Feb-1929 Today's Date: 07/16/2020    History of present illness 87 yo admitted 6/3 after syncope while in [facility] which pt slid out of WC getting his LLE caught. He was bradycardic with 2nd degree AV block resolved with medication. pt found to have LLE tibia fx treated non operatively. PMhx: HTN, HLD, CAD s/p CABG, muscular dystrophy (diagnosed when he was 87 y/o).   OT comments  Pt noted with increased sitting balance difficulties in comparison to initial OT eval. Session focused on sitting balance/endurance EOB and UE HEP education to maximize UB strength for transfers. Pt reliant on B UE support EOB and noted with posterior lean without UE support. Pt able to sit EOB for 6-7 min before fatiguing and completed UE HEP bed level. Continue to recommend SNF for short term rehab as pt is motivated to regain skills and return home safely.  HR 75bpm during activities   Follow Up Recommendations  SNF;Supervision/Assistance - 24 hour    Equipment Recommendations  None recommended by OT (appears well equipped)    Recommendations for Other Services      Precautions / Restrictions Precautions Precautions: Fall Precaution Comments: per pt no muscular control in LLE and foot drop in RLE, pt also with poor trunk control 6/9 Restrictions Weight Bearing Restrictions: Yes RLE Weight Bearing: Weight bearing as tolerated LLE Weight Bearing: Non weight bearing       Mobility Bed Mobility Overal bed mobility: Needs Assistance Bed Mobility: Supine to Sit;Sit to Supine     Supine to sit: Max assist;HOB elevated Sit to supine: Total assist   General bed mobility comments: Max A to get EOB - pt able to assist in pulling trunk with bedrail use but heavy assist needed to scoot/advance LEs    Transfers                 General transfer comment: deferred without +2 assist    Balance Overall balance  assessment: Needs assistance Sitting-balance support: Bilateral upper extremity supported;Feet supported Sitting balance-Leahy Scale: Poor Sitting balance - Comments: reliant on UE support EOB intermittent progression to one UE support Postural control: Posterior lean                                 ADL either performed or assessed with clinical judgement   ADL Overall ADL's : Needs assistance/impaired                     Lower Body Dressing: Total assistance;Bed level Lower Body Dressing Details (indicate cue type and reason): fo socks/LE mgmt in bed               General ADL Comments: Session focused on sitting balance EOB (unable to effectively attempt ADLs/exercises EOB without BUE support) and UE HEP education     Vision   Vision Assessment?: No apparent visual deficits   Perception     Praxis      Cognition Arousal/Alertness: Awake/alert Behavior During Therapy: WFL for tasks assessed/performed Overall Cognitive Status: Impaired/Different from baseline Area of Impairment: Safety/judgement;Following commands;Attention                   Current Attention Level: Selective   Following Commands: Follows one step commands with increased time;Follows one step commands consistently Safety/Judgement: Decreased awareness of safety;Decreased awareness of deficits     General Comments:  Pt with improving insight into deficits and the difficulties of ADLs/transfers, pleasant and participatory - follows all commands (increased time or repetition due to [facility])        Exercises     Shoulder Instructions       General Comments HR 75bpm    Pertinent Vitals/ Pain       Pain Assessment: Faces Faces Pain Scale: Hurts little more Pain Location: R knee with ROM or WB (dx with gout) Pain Descriptors / Indicators: Sore Pain Intervention(s): Limited activity within patient's tolerance;Monitored during session;Repositioned  Home Living                                           Prior Functioning/Environment              Frequency  Min 2X/week        Progress Toward Goals  OT Goals(current goals can now be found in the care plan section)  Progress towards OT goals: OT to reassess next treatment  Acute Rehab OT Goals Patient Stated Goal: go to [facility] for rehab then return home OT Goal Formulation: With patient Time For Goal Achievement: 07/24/20 Potential to Achieve Goals: Good ADL Goals Pt Will Perform Eating: with modified independence;with adaptive utensils;sitting Pt Will Perform Grooming: with modified independence;sitting Pt Will Perform Lower Body Dressing: with min assist;sitting/lateral leans Pt Will Transfer to Toilet: with mod assist Pt/caregiver will Perform Home Exercise Program: Increased strength;Both right and left upper extremity;With theraband;With theraputty;Independently;With written HEP provided  Plan Discharge plan remains appropriate    Co-evaluation                 AM-PAC OT "6 Clicks" Daily Activity     Outcome Measure   Help from another person eating meals?: A Little Help from another person taking care of personal grooming?: A Little Help from another person toileting, which includes using toliet, bedpan, or urinal?: Total Help from another person bathing (including washing, rinsing, drying)?: A Lot Help from another person to put on and taking off regular upper body clothing?: A Little Help from another person to put on and taking off regular lower body clothing?: Total 6 Click Score: 13    End of Session    OT Visit Diagnosis: Muscle weakness (generalized) (M62.81);History of falling (Z91.81)   Activity Tolerance Patient tolerated treatment well;Patient limited by fatigue   Patient Left in bed;with call bell/phone within reach;with bed alarm set   Nurse Communication Mobility status        Time: 1341-1418 OT Time Calculation (min): 37  min  Charges: OT General Charges $OT Visit: 1 Visit OT Treatments $Therapeutic Activity: 8-22 mins $Therapeutic Exercise: 8-22 mins  Bradd Canary, OTR/L Acute Rehab Services Office: [phone]    Lorre Munroe 07/16/2020, 2:36 PM

## 2020-07-16 NOTE — Progress Notes (Signed)
Progress Note  Patient Name: Frank Avila Date of Encounter: 07/16/2020  [facility] Cardiologist: Dr. Judithe Modest  Subjective   C/w knee pain   Inpatient Medications    Scheduled Meds:  amLODipine  10 mg Oral Daily   atorvastatin  20 mg Oral Daily   Chlorhexidine Gluconate Cloth  6 each Topical Daily   clopidogrel  75 mg Oral Daily   colchicine  0.6 mg Oral Daily   donepezil  10 mg Oral BID   gabapentin  800 mg Oral QPC supper   montelukast  10 mg Oral Daily   polyethylene glycol  17 g Oral Daily   primidone  25 mg Oral QHS   psyllium  1 packet Oral Daily   sodium chloride flush  3 mL Intravenous Q12H   tamsulosin  0.4 mg Oral QHS   vitamin B-12  1,000 mcg Oral Daily   Continuous Infusions:  sodium chloride     PRN Meds: sodium chloride, acetaminophen, lidocaine, melatonin, metoCLOPramide, prochlorperazine, senna-docusate, sodium chloride flush, traMADol   Vital Signs    Vitals:   07/15/20 1900 07/15/20 2309 07/16/20 0348 07/16/20 0740  BP: (!) 140/55 (!) 143/69 (!) 147/62 (!) 129/50  Pulse: 76 77 69 66  Resp: 18 19 20 18   Temp: 98.4 F (36.9 C) 98 F (36.7 C) 98.1 F (36.7 C) (!) 97.5 F (36.4 C)  TempSrc: Oral Oral Oral Oral  SpO2: 98% 97% 95% 96%  Weight:      Height:        Intake/Output Summary (Last 24 hours) at 07/16/2020 1003 Last data filed at 07/15/2020 1856 Gross per 24 hour  Intake 973 ml  Output 600 ml  Net 373 ml   Last 3 Weights 07/11/2020 07/09/2020 07/08/2020  Weight (lbs) 164 lb 10.9 oz 164 lb 7.4 oz 170 lb 13.7 oz  Weight (kg) 74.7 kg 74.6 kg 77.5 kg      Telemetry    SR generally 60-'s-70's, occsionally he has brief episodes of V bigemeny PVCs, also has PACs   07/13/20 2 episodes of transient SB to the 30's, in lengthy review with Dr. 09/12/20 with these events this is both P-P and PR prolongation.  He is also noted to have some periors that he alternates bundles though without bradycardia  Personally Reviewed  ECG     Personally Reviewed  EMS: sinus (48bpm, V bigeeny 1st degree AVblock Graciela Husbands Brown EKG/rhythm strips V bigemeny, rate 52, RBBB  Here SR 72bpm, RBBB, 1st degree AVblock, 360-451ms  Physical Exam   GEN: No acute distress.   Neck: No JVD Cardiac: RRR, no murmurs, rubs, or gallops.  Respiratory: Clear to auscultation bilaterally. GI: Soft, nontender, non-distended  MS: No edema; No deformity. Neuro:  in bed   Psych: Normal affect   Labs    High Sensitivity Troponin:  No results for input(s): TROPONINIHS in the last 720 hours.    Chemistry Recent Labs  Lab 07/12/20 0134 07/13/20 0337 07/14/20 0938 07/15/20 0042  NA 134* 132* 130* 130*  K 4.2 3.8 4.3 4.6  CL 102 100 100 101  CO2 20* 20* 20* 21*  GLUCOSE 114* 106* 138* 121*  BUN 35* 37* 38* 45*  CREATININE 2.13* 1.96* 2.00* 2.18*  CALCIUM 8.1* 7.8* 8.2* 7.9*  PROT 5.9* 5.9*  --   --   ALBUMIN 2.3* 2.2* 2.2* 2.1*  AST 23 45*  --   --   ALT 28 47*  --   --   ALKPHOS 72  94  --   --   BILITOT 0.7 0.6  --   --   GFRNONAA 29* 32* 31* 28*  ANIONGAP 12 12 10 8      Hematology Recent Labs  Lab 07/12/20 0134 07/13/20 0337 07/15/20 0042  WBC 11.1* 10.6* 7.9  RBC 3.14* 3.04* 2.94*  HGB 9.8* 9.5* 9.2*  HCT 29.7* 28.5* 27.5*  MCV 94.6 93.8 93.5  MCH 31.2 31.3 31.3  MCHC 33.0 33.3 33.5  RDW 12.4 12.3 12.1  PLT 316 332 405*    BNPNo results for input(s): BNP, PROBNP in the last 168 hours.   DDimer No results for input(s): DDIMER in the last 168 hours.   Radiology    No results found.  Cardiac Studies   2D echo 07/2020 IMPRESSIONS   1. Left ventricular ejection fraction, by estimation, is 60 to 65%. The  left ventricle has normal function. The left ventricle has no regional  wall motion abnormalities. Left ventricular diastolic parameters are  consistent with Grade II diastolic  dysfunction (pseudonormalization). Elevated left atrial pressure.   2. Right ventricular systolic function is normal. The right  ventricular  size is normal.   3. The mitral valve is normal in structure. No evidence of mitral valve  regurgitation. No evidence of mitral stenosis.   4. The aortic valve is normal in structure. Aortic valve regurgitation is  not visualized. Mild aortic valve sclerosis is present, with no evidence  of aortic valve stenosis.   5. The inferior vena cava is normal in size with greater than 50%  respiratory variability, suggesting right atrial pressure of 3 mmHg.  Patient Profile     87 y.o. male w/PMHx of CAD (CABG 1993, PCI to grafts 2012), HTN, HLD, spinal muscular dystrophy (uses motorized wheelchair)  Initially admitted to [facility] via [facility] where he was for syncope, he was found bradycardic in the 20's with reports of V bigemeny and other notes report some degree of AVblock described as second degree AVblock and reports of V bigemeny, in the environment of newly started propanolol (for tremor), temp pacing wire was placed, he was also found to have L tib-fib fx  Syncopal episode described as started to feel poorly becaiu=me nauseous, diaphoretic, vomited and fainted.  TTE noted LVEF 60-65%  Cardiology was consulted and his propanolol stopped  EP also consulted seen by Dr. 10-19-1992 at the time of his consult 07/07/20 he had regained conduction noting baseline RBBB, and suspected that he would not need pacing off BB tx  Started on primidone for his tremor Temp wire removed 07/08/20  Cardiology signed off 07/10/20 with no further bradycardia Recalled 07/13/20 with 2 transient bradycardic episodes, though had been nauseous all day and at least once with emesis and associated normal rates. We briefly discussed the case and in the environment of nausea suspected to have been vagal and monitoring for recurrent bradycardia without a potential tirigger was planned.  Cardiology saw the patient, note reports case discussed with prior cardiologist following and EP with no indication for pacing  though it seems at patient request or demand that he get a PPM implant and EP is asked to revisit  Assessment & Plan    Bradycardia  Initial presentation as far as I can tell was V bigeminy and subsequent bradycardia Though 2nd degree AVblock was discussed as well AV conduction recovered off the propanolol (rx for his tremor)  07/13/20 2 episodes of transient sinus bradycardia Notes report quite nauseous this day and telemetry supports  vagally mediated bradycardia  Dr. Graciela Husbands has seen and examined the patient At this time, no clear indication for PPM Continue to treat/manage his nausea Continue to avoid nodal blocking agents    For questions or updates, please contact [facility] Please consult [email or web address] for contact info under        Signed, Sheilah Pigeon, PA-C  07/16/2020, 10:03 AM    Bradycardia-sinus and functional  Syncope/presyncope associated with nausea  Coronary artery disease with prior bypass  Ember previously treated with propranolol  Spinal muscular atrophy-wheelchair-bound   Repeat consultation was requested.  History taken again.  He has had problems with recurrent nausea without emesis.  Even that prompted hospitalization was qualitatively similar to the event 6/10 though strips of which were reviewed.  Sinus slowing was seen and concomitant with this was mild prolongation of the PR interval approximately 210--240 ms.  The combination of these events suggest hyper vagotonia and that in contrast to the patient's hypothesis that the nausea was secondary to the bradycardia is much more likely that the bradycardia was secondary to the nausea.  Hence, the therapeutic target of choice is to understand and relieve nausea.  Vasovagal events, if they cannot be controlled, can in some cases have their untoward effects be mitigated by pacing particularly with rate drop/CLS technology as they may intervene upon the reflex outcomes such as to overcome vasomotor  dilatation and hypotension that occurs concomitantly with but is not strictly consequential to the bradycardia.  He is an amazing man.  He lives by himself and is self-sufficient moving around his house from wheelchair to bed to toilet to wheelchair etc.  Hence, there is not a trivial note of anxiety related to the risk of recurrent syncope.  We spent a long time discussing vasomotor/neurally mediated syncope and the need to address targets.  I also suggested that sometimes pacing can be utilized if triggers could not be identified and mitigated.  Moreover, the propranolol dose of 20 mg a day would not seem to be sufficient to cause the degree of bradycardia if there were not a component of cardio inhibition.  Hence, I suggested that implantation of a loop recorder to follow for bradycardia following discharge would be helpful.  The care of this device and monitoring of this could be transferred  to his primary cardiologist in [place].

## 2020-07-16 NOTE — TOC Progression Note (Signed)
Transition of Care [facility]) - Progression Note    Patient Details  Name: Frank Avila MRN: [id number] Date of Birth: Mar 04, 1928  Transition of Care [facility]) CM/SW Contact  Eduard Roux, [place] Phone Number: 07/16/2020, 1:32 PM  Clinical Narrative:     CSW spoke with [name]- informed patient has been approved by [facility] for [facility]- Will seek placement w/[facility] contract- she advised patient has medicare too and wants CSW to use medicare benefits for placement in [place], preferred SNF is [facility]. She will  send CSW copy of medicare benefits card.   CSW contacted [facility] [place]- sent referral and left voice message to review for possible placement.  CSW will continue to follow and assist with discharge planning.   Antony Blackbird, MSW, LCSW Clinical Social Worker    Expected Discharge Plan: Skilled Nursing Facility Barriers to Discharge: Continued Medical Work up  Expected Discharge Plan and Services Expected Discharge Plan: Skilled Nursing Facility In-house Referral: Clinical Social Work     Living arrangements for the past 2 months: Single Family Home                                       Social Determinants of Health (SDOH) Interventions    Readmission Risk Interventions No flowsheet data found.

## 2020-07-17 DIAGNOSIS — Z7902 Long term (current) use of antithrombotics/antiplatelets: Secondary | ICD-10-CM

## 2020-07-17 DIAGNOSIS — R197 Diarrhea, unspecified: Secondary | ICD-10-CM

## 2020-07-17 DIAGNOSIS — M25472 Effusion, left ankle: Secondary | ICD-10-CM

## 2020-07-17 DIAGNOSIS — M25572 Pain in left ankle and joints of left foot: Secondary | ICD-10-CM

## 2020-07-17 DIAGNOSIS — R1010 Upper abdominal pain, unspecified: Secondary | ICD-10-CM

## 2020-07-17 DIAGNOSIS — R112 Nausea with vomiting, unspecified: Secondary | ICD-10-CM

## 2020-07-17 LAB — GLUCOSE, CAPILLARY: Glucose-Capillary: 98 mg/dL (ref 70–99)

## 2020-07-17 MED ORDER — TRAMADOL HCL 50 MG PO TABS: 50.0000 mg | ORAL_TABLET | Freq: Two times a day (BID) | ORAL | 0 refills | Status: DC | PRN

## 2020-07-17 MED ORDER — PRIMIDONE 50 MG PO TABS: 25.0000 mg | ORAL_TABLET | Freq: Every day | ORAL | 0 refills | Status: DC

## 2020-07-17 MED ORDER — PANTOPRAZOLE SODIUM 40 MG PO TBEC
40.0000 mg | DELAYED_RELEASE_TABLET | Freq: Every day | ORAL | Status: DC
  Administered 2020-07-18 – 2020-07-20 (×3): 40 mg via ORAL
  Filled 2020-07-17 (×3): qty 1

## 2020-07-17 MED ORDER — MELATONIN 3 MG PO TABS: 3.0000 mg | ORAL_TABLET | Freq: Every evening | ORAL | 0 refills | Status: AC | PRN

## 2020-07-17 MED ORDER — PANTOPRAZOLE SODIUM 40 MG PO TBEC: 40.0000 mg | DELAYED_RELEASE_TABLET | Freq: Every day | ORAL | 0 refills | Status: DC

## 2020-07-17 MED ORDER — COLCHICINE 0.6 MG PO TABS: 0.6000 mg | ORAL_TABLET | Freq: Every day | ORAL | 0 refills | Status: DC

## 2020-07-17 MED ORDER — SENNOSIDES-DOCUSATE SODIUM 8.6-50 MG PO TABS: 1.0000 | ORAL_TABLET | Freq: Every evening | ORAL | 0 refills | Status: DC | PRN

## 2020-07-17 MED ORDER — FUROSEMIDE 40 MG PO TABS
40.0000 mg | ORAL_TABLET | Freq: Every day | ORAL | Status: DC
  Administered 2020-07-17 – 2020-07-20 (×4): 40 mg via ORAL
  Filled 2020-07-17 (×4): qty 1

## 2020-07-17 NOTE — Progress Notes (Addendum)
Progress Note  Patient Name: Frank Avila Date of Encounter: 07/17/2020  [facility] Cardiologist: Dr. Judithe Modest  Subjective   Nausea again yesterday especially when OOB, some diarrhea also yesterday, no CP, SOB  Inpatient Medications    Scheduled Meds:  amLODipine  10 mg Oral Daily   atorvastatin  20 mg Oral Daily   Chlorhexidine Gluconate Cloth  6 each Topical Daily   clopidogrel  75 mg Oral Daily   colchicine  0.6 mg Oral Daily   donepezil  10 mg Oral BID   furosemide  40 mg Oral Daily   gabapentin  800 mg Oral QPC supper   montelukast  10 mg Oral Daily   polyethylene glycol  17 g Oral Daily   primidone  25 mg Oral QHS   psyllium  1 packet Oral Daily   sodium chloride flush  3 mL Intravenous Q12H   tamsulosin  0.4 mg Oral QHS   vitamin B-12  1,000 mcg Oral Daily   Continuous Infusions:  sodium chloride     PRN Meds: sodium chloride, acetaminophen, lidocaine, melatonin, metoCLOPramide, prochlorperazine, senna-docusate, sodium chloride flush, traMADol   Vital Signs    Vitals:   07/16/20 2012 07/16/20 2301 07/17/20 0453 07/17/20 0749  BP: (!) 148/56 (!) 139/57 (!) 143/60 (!) 142/56  Pulse: 65 69 69 67  Resp: 16 15 17 20   Temp: 97.6 F (36.4 C) 97.6 F (36.4 C) 97.7 F (36.5 C) 97.9 F (36.6 C)  TempSrc: Oral Oral Oral Oral  SpO2: 100% 98% 97% 96%  Weight:      Height:        Intake/Output Summary (Last 24 hours) at 07/17/2020 0839 Last data filed at 07/17/2020 0455 Gross per 24 hour  Intake --  Output 700 ml  Net -700 ml   Last 3 Weights 07/11/2020 07/09/2020 07/08/2020  Weight (lbs) 164 lb 10.9 oz 164 lb 7.4 oz 170 lb 13.7 oz  Weight (kg) 74.7 kg 74.6 kg 77.5 kg      Telemetry    SB/SR high 50's-60's - Personally Reviewed  ECG    No new EKGs - Personally Reviewed  Physical Exam   Examined by Dr. 09/07/2020 GEN: No acute distress.   Neck: No JVD Cardiac: RRR, no murmurs, rubs, or gallops.  Respiratory: CTA b/l. GI: Soft, nontender,  non-distended  MS: No edema; No deformity. Neuro:  Nonfocal  Psych: Normal affect   Labs    High Sensitivity Troponin:  No results for input(s): TROPONINIHS in the last 720 hours.    Chemistry Recent Labs  Lab 07/12/20 0134 07/13/20 0337 07/14/20 0938 07/15/20 0042  NA 134* 132* 130* 130*  K 4.2 3.8 4.3 4.6  CL 102 100 100 101  CO2 20* 20* 20* 21*  GLUCOSE 114* 106* 138* 121*  BUN 35* 37* 38* 45*  CREATININE 2.13* 1.96* 2.00* 2.18*  CALCIUM 8.1* 7.8* 8.2* 7.9*  PROT 5.9* 5.9*  --   --   ALBUMIN 2.3* 2.2* 2.2* 2.1*  AST 23 45*  --   --   ALT 28 47*  --   --   ALKPHOS 72 94  --   --   BILITOT 0.7 0.6  --   --   GFRNONAA 29* 32* 31* 28*  ANIONGAP 12 12 10 8      Hematology Recent Labs  Lab 07/12/20 0134 07/13/20 0337 07/15/20 0042  WBC 11.1* 10.6* 7.9  RBC 3.14* 3.04* 2.94*  HGB 9.8* 9.5* 9.2*  HCT 29.7* 28.5*  27.5*  MCV 94.6 93.8 93.5  MCH 31.2 31.3 31.3  MCHC 33.0 33.3 33.5  RDW 12.4 12.3 12.1  PLT 316 332 405*    BNPNo results for input(s): BNP, PROBNP in the last 168 hours.   DDimer No results for input(s): DDIMER in the last 168 hours.   Radiology    No results found.  Cardiac Studies   2D echo 07/2020 IMPRESSIONS   1. Left ventricular ejection fraction, by estimation, is 60 to 65%. The  left ventricle has normal function. The left ventricle has no regional  wall motion abnormalities. Left ventricular diastolic parameters are  consistent with Grade II diastolic  dysfunction (pseudonormalization). Elevated left atrial pressure.   2. Right ventricular systolic function is normal. The right ventricular  size is normal.   3. The mitral valve is normal in structure. No evidence of mitral valve  regurgitation. No evidence of mitral stenosis.   4. The aortic valve is normal in structure. Aortic valve regurgitation is  not visualized. Mild aortic valve sclerosis is present, with no evidence  of aortic valve stenosis.   5. The inferior vena cava is  normal in size with greater than 50%  respiratory variability, suggesting right atrial pressure of 3 mmHg.  Patient Profile     87 y.o. male male w/PMHx of CAD (CABG 1993, PCI to grafts 2012), HTN, HLD, spinal muscular dystrophy (uses motorized wheelchair)   Initially admitted to [facility] via [facility] where he was for syncope, he was found bradycardic in the 20's with reports of V bigemeny and other notes report some degree of AVblock described as second degree AVblock and reports of V bigemeny, in the environment of newly started propanolol (for tremor), temp pacing wire was placed, he was also found to have L tib-fib fx   Syncopal episode described as started to feel poorly becaiu=me nauseous, diaphoretic, vomited and fainted.   TTE noted LVEF 60-65%   Cardiology was consulted and his propanolol stopped EP also consulted seen by Dr. Ladona Ridgel at the time of his consult 07/07/20 he had regained conduction noting baseline RBBB, and suspected that he would not need pacing off BB tx   Started on primidone for his tremor Temp wire removed 07/08/20   Cardiology signed off 07/10/20 with no further bradycardia Recalled 07/13/20 with 2 transient bradycardic episodes, though had been nauseous all day and at least once with emesis and associated normal rates. We briefly discussed the case and in the environment of nausea suspected to have been vagal and monitoring for recurrent bradycardia without a potential tirigger was planned.   Cardiology saw the patient, note reports case discussed with prior cardiologist following and EP with no indication for pacing though it seems at patient request or demand that he get a PPM implant and EP is asked to revisit  Assessment & Plan    Bradycardia   Initial presentation as far as I can tell was V bigeminy and subsequent bradycardia Though 2nd degree AVblock was discussed as well AV conduction recovered off the propanolol (rx for his tremor)   07/13/20 2  episodes of transient sinus bradycardia Notes report quite nauseous this day and telemetry supports vagally mediated bradycardia   Nauseous again yesterday and reports diarrhea as well yesterday without bradycardia Further supports bradycardia as 2/2 his nausea/vagally mediated  Plan for loop implant day of discharge Dr. Graciela Husbands has seen the patient this morning We will follow from afar, please call when discharge timing is known and we  can plan for loop implant    For questions or updates, please contact [facility] Please consult [email or web address] for contact info under        Signed, Sheilah Pigeon, PA-C  07/17/2020, 8:39 AM     Discussed with hospitalists regarding GI consultation for purpose of understanding nausea whichseems to be the trigger for his brady events  Would monitor at discharge.

## 2020-07-17 NOTE — TOC Progression Note (Signed)
Transition of Care [facility]) - Progression Note    Patient Details  Name: Frank Avila MRN: [id number] Date of Birth: 09-26-1928  [facility]) CM/SW Contact  Terrial Rhodes, LCSWA Phone Number: 07/17/2020, 5:37 PM  Clinical Narrative:     Patient spouse is bringing in medicare card. [place] with [employer] reviewing referral to see if [facility] can make SNF bed offer. CSW will continue to follow and assist with dc planning needs.  Expected Discharge Plan: Skilled Nursing Facility Barriers to Discharge: Continued Medical Work up  Expected Discharge Plan and Services Expected Discharge Plan: Skilled Nursing Facility In-house Referral: Clinical Social Work     Living arrangements for the past 2 months: Single Family Home                                       Social Determinants of Health (SDOH) Interventions    Readmission Risk Interventions No flowsheet data found.

## 2020-07-17 NOTE — Progress Notes (Signed)
PROGRESS NOTE   Frank Avila  JJK:[id number] DOB: [birth year] DOA: 07/06/2020 PCP: No primary care provider on file.  Brief Narrative:  87 year old veteran white male baseline wheelchair-bound Spinal muscular dystrophy [is extremely high functioning at baseline still driving and doing chores in his wheelchair etc. etc.] HTN CABG 1993, PCI 2012 followed by Dr. Vernona Rieger at [facility]  Admit 07/06/2020 by cardiology secondary to LOC in the setting of recent addition of medication for tremors Found to be bradycardic rates in the 20s-Rx atropine by [facility] emergency room transvenous pacing wire placed + distal left tib-fib fractur  Cardiology consulted--bradycardia felt 2/2 propanolol Orthopedics consulted in addition--- had surgery Overnight 6/8 developed severe right knee pain--underwent arthrocentesis showing monosodium urate crystals  Patient again developed bradycardia a.m. 07/13/2020 prompting reconsult from cardiology EP to see 6/13 to discussed with the patient   Hospital-Problem based course   Persistent nausea vomiting 6/10 Intermittent with unclear cause Continue Compazine 10 every 6 as needed nausea  Reglan as needed ordered to see if helps Nausea somewhat improved GI consulted to assist with work-up and planning Patient now tells me after several days in the hospital that he has always had "nausea" and tells me he gets carsick and sometimes sick on airplanes This is different however-I will ask PT to consider vestibular rehab on him-my screening on him does not however suggest the same Acute gout flare R Kne Colchicine reattempted and patient is improving with this WBAT R leg--status post steroid injection 6/8 orthopedics Synovial cultures 6/8 no growth-no antibiotics Chronic essential tremors Previous Rx propanolol stopped 2/2 bradycardia Continue primidone low-dose 25 mg bradycardia episodic bradycardia into the 30s prior to vasovagal event with nausea- EP  consulted 6/13  feel event monitor most appropriate action at this time and will need to be ordered on discharge Do not resume beta-blocker-marked as allergy Distal left tib-fib fracture LLE Defer to orthopedics-tramadol for moderate pain- seems controlled  Nonweightbearing left leg Careful gabapentin-continue 800 mg dosing  UTI? Follow [facility] x 2 blood culture X2 negative--Urine culture not performed --UA suggestive of cystitis Flomax 0.4 daily Bactrim DC 6/12 with no recurrence of fever HTN Amlodipine increased from 5-->10 qam. CABG CAD 1993 Plavix 75 daily, no BB Consider OP ARB AKI superimposed on CKD 3B Stop Monday Wednesday Friday Lasix 40 mg for now  DVT prophylaxis: SCD Code Status: FULL Family Communication: Per patient Discussed with niece [name] [phone] on 6/10 Updated on 6/13 Likely SNF  when has been seen by GI [?  Endoscopy needed]and vestibular rehab has been considered Disposition:  Status is: Inpatient  Remains inpatient appropriate because:Hemodynamically unstable, Altered mental status and Unsafe d/c plan   Dispo: The patient is from: Home              Anticipated d/c is to: Home              Patient currently is not medically stable to d/c.   Difficult to place patient Yes  Consultants:  Ortho cardiology  Procedures:   Antimicrobials:     Subjective:  Nausea seems fair He has not had emesis today - fever - chills - dark stool - tarry stool  Objective: Vitals:   07/16/20 2301 07/17/20 0453 07/17/20 0749 07/17/20 1157  BP: (!) 139/57 (!) 143/60 (!) 142/56 (!) 142/52  Pulse: 69 69 67 73  Resp: 15 17 20 18   Temp: 97.6 F (36.4 C) 97.7 F (36.5 C) 97.9 F (36.6 C) 98.1 F (36.7 C)  TempSrc: Oral Oral Oral Oral  SpO2: 98% 97% 96% 94%  Weight:      Height:        Intake/Output Summary (Last 24 hours) at 07/17/2020 1614 Last data filed at 07/17/2020 1159 Gross per 24 hour  Intake --  Output 1450 ml  Net -1450 ml    Filed Weights    07/08/20 0350 07/09/20 0600 07/11/20 0329  Weight: 77.5 kg 74.6 kg 74.7 kg    Examination:  EOMI NCAT no focal deficit Moderate dentition no icterus no pallor Neck soft supple  S1-S2--not appreciate any murmurs rubs or gallops CTA B no added sound no rales no rhonchi neurologically intact moving 4 limbs equally but limited in lower extremities secondary to right knee pain and left lower extremity cast I cannot appreciate any endpoint nystagmus on exam to suggest BPPV   Data Reviewed: personally reviewed   CBC    Component Value Date/Time   WBC 7.9 07/15/2020 0042   RBC 2.94 (L) 07/15/2020 0042   HGB 9.2 (L) 07/15/2020 0042   HCT 27.5 (L) 07/15/2020 0042   PLT 405 (H) 07/15/2020 0042   MCV 93.5 07/15/2020 0042   MCH 31.3 07/15/2020 0042   MCHC 33.5 07/15/2020 0042   RDW 12.1 07/15/2020 0042   LYMPHSABS 0.5 (L) 07/15/2020 0042   MONOABS 0.7 07/15/2020 0042   EOSABS 0.0 07/15/2020 0042   BASOSABS 0.0 07/15/2020 0042   CMP Latest Ref Rng & Units 07/15/2020 07/14/2020 07/13/2020  Glucose 70 - 99 mg/dL 601(U) 932(T) 557(D)  BUN 8 - 23 mg/dL 22(G) 25(K) 27(C)  Creatinine 0.61 - 1.24 mg/dL 6.23(J) 6.28(B) 1.51(V)  Sodium 135 - 145 mmol/L 130(L) 130(L) 132(L)  Potassium 3.5 - 5.1 mmol/L 4.6 4.3 3.8  Chloride 98 - 111 mmol/L 101 100 100  CO2 22 - 32 mmol/L 21(L) 20(L) 20(L)  Calcium 8.9 - 10.3 mg/dL 7.9(L) 8.2(L) 7.8(L)  Total Protein 6.5 - 8.1 g/dL - - 5.9(L)  Total Bilirubin 0.3 - 1.2 mg/dL - - 0.6  Alkaline Phos 38 - 126 U/L - - 94  AST 15 - 41 U/L - - 45(H)  ALT 0 - 44 U/L - - 47(H)    Radiology Studies: No results found.   Scheduled Meds:  amLODipine  10 mg Oral Daily   atorvastatin  20 mg Oral Daily   Chlorhexidine Gluconate Cloth  6 each Topical Daily   clopidogrel  75 mg Oral Daily   colchicine  0.6 mg Oral Daily   donepezil  10 mg Oral BID   furosemide  40 mg Oral Daily   gabapentin  800 mg Oral QPC supper   montelukast  10 mg Oral Daily   [START ON  07/18/2020] pantoprazole  40 mg Oral Q0600   polyethylene glycol  17 g Oral Daily   primidone  25 mg Oral QHS   psyllium  1 packet Oral Daily   sodium chloride flush  3 mL Intravenous Q12H   tamsulosin  0.4 mg Oral QHS   vitamin B-12  1,000 mcg Oral Daily   Continuous Infusions:  sodium chloride       LOS: 11 days   Time spent: 81  Rhetta Mura, MD [facility] To contact the attending provider between 7A-7P or the covering provider during after hours 7P-7A, please log into the web site [email or web address] and access using universal [place] password for that web site. If you do not have the password, please call the hospital operator.  07/17/2020, 4:14 PM

## 2020-07-17 NOTE — Consult Note (Signed)
[facility] Consult: 11:13 AM 07/17/2020  LOS: 11 days    Referring Provider: Dr Mahala Menghini  Primary Care Physician:  [facility] Jethro Poling and Dr Donnel Saxon in [place].   Primary Gastroenterologist:  unassigned.        Reason for Consultation: Nausea.   HPI: Frank Avila is a 87 y.o. male.  PMH CAD.  1993 CABG, 2012 PCI.  Chronic Plavix.  Hypertension.  Hyperlipidemia.  Spinal muscular atrophy, wheelchair-bound for decades. No prior upper endoscopy but underwent uneventful screening colonoscopies in the past by MD in [place].  Admitted 10 days ago after syncope A/W bradycardia in the 20s.  Had fallen out of his wheelchair at home.  In addition to the bradycardia findings, discovered to have a distal left tib-fib fracture which has been casted. Has required transvenous pacing.   AV conduction abnormalities resolved after discontinuation of propranolol which has been added recently to address tremors. Patient denies GI issues at home but does have a longstanding history of motion sickness.  Since admission he has had a few episodes of nausea but never vomited.  Triggers for the nausea include position changes.  This happened at least a couple of times while working with PT and other staff when he was asked to sit on the side of the bed after laying down.  Pt denies associated dizziness.  However charting by providers mentioned he had a syncopal episode, was diaphoretic and vomited.  Electrophysiology provider notes mention telemetry supporting vagally mediated bradycardia.  Another trigger recently was food related after eating most of his meal.  He has never had nausea like this at home.  Had diarrhea yesterday however was started on colchicine the day before for suspected right knee gout which was also treated with  steroid injection.  Patient denies previous history of gout.  No H2 blocker or PPI PTA or since admission.  Compazine not of great benefit.  Metoclopramide 5 mg po prn added 6/12.     Past Medical History:  Diagnosis Date   Acute gout of knee, Right 07/12/2020   Bradycardia    CAD (coronary artery disease)    Closed fracture of shaft of left tibia 07/12/2020   Dementia (HCC)    GERD (gastroesophageal reflux disease)    HLD (hyperlipidemia)    HTN (hypertension)     Past Surgical History:  Procedure Laterality Date   unknown      Prior to Admission medications   Medication Sig Start Date End Date Taking? Authorizing Provider  acetaminophen (TYLENOL) 500 MG tablet Take 500 mg by mouth See admin instructions. Take one tablet (500 mg) by mouth three times daily - morning, supper and bedtime 12/20/19  Yes [provider]  amLODipine (NORVASC) 5 MG tablet Take 5 mg by mouth every morning. 05/28/20  Yes [provider]  atorvastatin (LIPITOR) 20 MG tablet Take 20 mg by mouth every morning. 07/05/20  Yes [provider]  cetirizine (ZYRTEC) 10 MG tablet Take 10 mg by mouth at bedtime. 11/02/19  Yes [provider]  clopidogrel (PLAVIX)  75 MG tablet Take 75 mg by mouth every morning. 05/13/20  Yes [provider]  donepezil (ARICEPT) 10 MG tablet Take 10 mg by mouth 2 (two) times daily. 06/16/20  Yes [provider]  furosemide (LASIX) 40 MG tablet Take 40 mg by mouth every Monday, Wednesday, and Friday. 06/14/20  Yes [provider]  gabapentin (NEURONTIN) 400 MG capsule Take 800 mg by mouth daily after supper. 11/02/19  Yes [provider]  montelukast (SINGULAIR) 10 MG tablet Take 10 mg by mouth every morning. 11/02/19  Yes [provider]  Multiple Vitamin (MULTIVITAMIN WITH MINERALS) TABS tablet Take 1 tablet by mouth every morning.   Yes [provider]  Omega-3 Fatty Acids (FISH OIL) 1000 MG CAPS Take 1,000  mg by mouth See admin instructions. Take one capsule (1000 mg) by mouth three times daily - morning, supper and bedtime   Yes [provider]  Polyethylene Glycol 3350 (MIRALAX PO) Take 15 mLs by mouth See admin instructions. Mix 1 tbsp (15 ml) in 18 oz water and drink nightly (with 3 tbsp psyllium fiber)   Yes [provider]  propranolol (INDERAL) 20 MG tablet Take 20 mg by mouth every morning. For tremors 06/05/20  Yes [provider]  Psyllium (METAMUCIL PO) Take 45 mLs by mouth See admin instructions. Mix 3 tbsp (45 ml) in 18 oz water and drink nightly (with 1 tbsp miralax powder)   Yes [provider]  tamsulosin (FLOMAX) 0.4 MG CAPS capsule Take 0.4 mg by mouth at bedtime. 03/05/20  Yes [provider]  vitamin B-12 (CYANOCOBALAMIN) 500 MCG tablet Take 1,000 mcg by mouth every morning. 06/20/20  Yes [provider]    Scheduled Meds:  amLODipine  10 mg Oral Daily   atorvastatin  20 mg Oral Daily   Chlorhexidine Gluconate Cloth  6 each Topical Daily   clopidogrel  75 mg Oral Daily   colchicine  0.6 mg Oral Daily   donepezil  10 mg Oral BID   furosemide  40 mg Oral Daily   gabapentin  800 mg Oral QPC supper   montelukast  10 mg Oral Daily   polyethylene glycol  17 g Oral Daily   primidone  25 mg Oral QHS   psyllium  1 packet Oral Daily   sodium chloride flush  3 mL Intravenous Q12H   tamsulosin  0.4 mg Oral QHS   vitamin B-12  1,000 mcg Oral Daily   Infusions:  sodium chloride     PRN Meds: sodium chloride, acetaminophen, lidocaine, melatonin, metoCLOPramide, prochlorperazine, senna-docusate, sodium chloride flush, traMADol   Allergies as of 07/06/2020 - Review Complete 07/06/2020  Allergen Reaction Noted   Codeine Other (See Comments) 07/06/2020   Penicillins Other (See Comments) 07/06/2020    History reviewed. No pertinent family history.  Social History   Socioeconomic History   Marital status: Married    Spouse  name: Not on file   Number of children: Not on file   Years of education: Not on file   Highest education level: Not on file  Occupational History   Not on file  Tobacco Use   Smoking status: Unknown   Smokeless tobacco: Not on file  Substance and Sexual Activity   Alcohol use: Not on file   Drug use: Not on file   Sexual activity: Not on file  Other Topics Concern   Not on file  Social History Narrative   Not on file   Social Determinants of  Health   Financial Resource Strain: Not on file  Food Insecurity: Not on file  Transportation Needs: Not on file  Physical Activity: Not on file  Stress: Not on file  Social Connections: Not on file  Intimate Partner Violence: Not on file    REVIEW OF SYSTEMS: Constitutional: Feels a bit tired and weak but nothing profound. ENT:  No nose bleeds Pulm: Does not feel short of breath and denies cough. CV:  No palpitations, no LE edema.  No angina. GU:  No hematuria, no frequency GI: Swallowing normal.  Appetite fair.  See HPI. Heme: Denies unusual or excessive bleeding or bruising. Transfusions: None Neuro:  No headaches, no peripheral tingling or numbness Derm:  No itching, no rash or sores.  Endocrine:  No sweats or chills.  No polyuria or dysuria Immunization: Not queried.   PHYSICAL EXAM: Vital signs in last 24 hours: Vitals:   07/17/20 0453 07/17/20 0749  BP: (!) 143/60 (!) 142/56  Pulse: 69 67  Resp: 17 20  Temp: 97.7 F (36.5 C) 97.9 F (36.6 C)  SpO2: 97% 96%   Wt Readings from Last 3 Encounters:  07/11/20 74.7 kg    General: Aged but overall well-appearing and alert.  Does not appear frail.  Comfortable lying in the bed. Head: No facial asymmetry or swelling.  No signs of head trauma. Eyes: No scleral icterus.  No conjunctival pallor.  EOMI. Ears: No obvious hearing deficit Nose: No congestion or discharge Mouth: Good dental repair.  Tongue midline.  Mucosa moist, pink, clear. Neck: No JVD, no masses, no  thyromegaly Lungs: Clear bilaterally without labored breathing or cough. Heart: RRR.  No MRG.  S1, S2 present. Abdomen: Soft without tenderness.  No distention.  Active bowel sounds.  No HSM, masses, bruits, hernias.   Rectal: Deferred Musc/Skeltl: No joint redness, swelling or gross deformity.  Specifically the right knee although deformed is not erythematous, tender or warm to the touch Extremities: No CCE.  Cast from foot to knee on the left. Neurologic: Oriented x3.  Fully alert.  No tremors.  Moves all 4 limbs, strength not formally tested. Skin: No telangiectasia, no significant purpura or bruising.  No suspicious lesions. Nodes: No cervical adenopathy Psych: Calm, cooperative, normal affect.  Fluid speech.  Intake/Output from previous day: 06/13 0701 - 06/14 0700 In: -  Out: 700 [Urine:700] Intake/Output this shift: No intake/output data recorded.  LAB RESULTS: Recent Labs    07/15/20 0042  WBC 7.9  HGB 9.2*  HCT 27.5*  PLT 405*   BMET Lab Results  Component Value Date   NA 130 (L) 07/15/2020   NA 130 (L) 07/14/2020   NA 132 (L) 07/13/2020   K 4.6 07/15/2020   K 4.3 07/14/2020   K 3.8 07/13/2020   CL 101 07/15/2020   CL 100 07/14/2020   CL 100 07/13/2020   CO2 21 (L) 07/15/2020   CO2 20 (L) 07/14/2020   CO2 20 (L) 07/13/2020   GLUCOSE 121 (H) 07/15/2020   GLUCOSE 138 (H) 07/14/2020   GLUCOSE 106 (H) 07/13/2020   BUN 45 (H) 07/15/2020   BUN 38 (H) 07/14/2020   BUN 37 (H) 07/13/2020   CREATININE 2.18 (H) 07/15/2020   CREATININE 2.00 (H) 07/14/2020   CREATININE 1.96 (H) 07/13/2020   CALCIUM 7.9 (L) 07/15/2020   CALCIUM 8.2 (L) 07/14/2020   CALCIUM 7.8 (L) 07/13/2020   LFT Recent Labs    07/15/20 0042  ALBUMIN 2.1*   PT/INR Lab Results  Component Value Date   INR 1.1 07/06/2020   Hepatitis Panel No results for input(s): HEPBSAG, HCVAB, HEPAIGM, HEPBIGM in the last 72 hours. C-Diff No components found for: CDIFF Lipase  No results found for:  LIPASE  Drugs of Abuse  No results found for: LABOPIA, COCAINSCRNUR, LABBENZ, AMPHETMU, THCU, LABBARB   RADIOLOGY STUDIES: No results found.    IMPRESSION:      Nausea, non-bloody vomiting, triggered by motion/position changes as well as intermittently by meals.  EP team feels this is associated with vasovagal bradycardia.  Per patient report, nausea is new since admission.  Previous longstanding history of motion sickness.  Does not require or use acid suppressing medications at home.     Diarrhea, nonbloody.  Suspect this may be due to newly added colchicine.      Bradycardia, baseline second-degree AV block.  Bradycardia resolved after discontinuation of propanolol which had recently been added to address tremors.  Question nausea, vagally mediated bradycardia.  Electrophysiology team to discharge.     Left tib-fib fracture, treated nonoperatively with casting.   Colchicine added for suspected right knee gout      Muscular dystrophy diagnosed at 87 years old.  Has been in a wheelchair for decades.    PLAN:         Added daily Protonix.   ?  Pursue EGD?   Will discuss with Dr. Meridee Score.      Jennye Moccasin  07/17/2020, 11:13 AM Phone [phone]

## 2020-07-17 NOTE — Progress Notes (Signed)
Orthopedic Tech Progress Note Patient Details:  Frank Avila 08-13-1928 [id number]  PA PAUL sent a message asking me to apply a SHORT LEG CAST well padded on patient.   Casting Type of Cast: Short leg cast Cast Location: LLE Cast Material: Fiberglass Cast Intervention: Application  Post Interventions Patient Tolerated: Well Instructions Provided: Care of device   Donald Pore 07/17/2020, 9:53 AM

## 2020-07-17 NOTE — Discharge Summary (Signed)
Physician Discharge Summary  Frank Avila ZOX:[id number] DOB: 11-Mar-1928 DOA: 07/06/2020  PCP: No primary care provider on file.  Admit date: 07/06/2020 Discharge date: 07/17/2020  Time spent: 37 minutes  Recommendations for Outpatient Follow-up:  Follow-up H. pylori performed on discharg May need follow-up vestibular rehab Discharging to skilled facility 6/15-summary done and will be updated with initial vestibular evaluation by therapy on 6/15 Recommend outpatient discussion regarding further work-up for bradycardia to be deferred to PCP and coordination with his cardiologist Needs Chem-12, magnesium, CBC in about 1 week at facility Needs event monitor set up by EP to be followed by them in the outpatient setting Will need follow-up with Ortho trauma specialists, [name], PA with them has been CCed on this note to organize lower extremity wound follow-up   Discharge Diagnoses:  MAIN problem for hospitalization   Severe bradycardia causing fall Fracture left lower extremity Gout,  Please see below for itemized issues addressed in HOpsital- refer to other progress notes for clarity if needed  Discharge Condition: Improved  Diet recommendation: Heart healthy  Filed Weights   07/08/20 0350 07/09/20 0600 07/11/20 0329  Weight: 77.5 kg 74.6 kg 74.7 kg    History of present illness:  87 year old veteran white male baseline wheelchair-bound Spinal muscular dystrophy [is extremely high functioning at baseline still driving and doing chores in his wheelchair etc. etc.] HTN CABG 1993, PCI 2012 followed by Dr. Vernona Rieger at [facility]  Admit 07/06/2020 by cardiology secondary to LOC in the setting of recent addition of medication for tremors Found to be bradycardic rates in the 20s-Rx atropine by [facility] emergency room transvenous pacing wire placed + distal left tib-fib fractur  Cardiology consulted--bradycardia felt 2/2 propanolol Orthopedics consulted in  addition--- had surgery Overnight 6/8 developed severe right knee pain--underwent arthrocentesis showing monosodium urate crystals   Patient again developed bradycardia a.m. 07/13/2020 prompting reconsult from cardiology EP to see 6/13 to discussed with the patient  Hospital Course:  Persistent nausea vomiting 6/10 Intermittent with unclear cause Continue Compazine 10 every 6 as needed nausea Reglan as needed ordered to see if helps Nausea somewhat improved GI consulted to assist with work-up and planning Patient now tells me after several days in the hospital that he has always had "nausea" and tells me he gets carsick and sometimes sick on airplanes Patient can discharge once seen by vestibular rehab Acute gout flare R Kne Colchicine reattempted [not useful initially?]-Discharged on colchicine-careful with interaction between this and atorvastatin WBAT R leg--status post steroid injection 6/8 orthopedics Synovial cultures 6/8 no growth-no antibiotics Chronic essential tremors Previous Rx propanolol stopped 2/2 bradycardia Continue primidone low-dose 25 mg bradycardia episodic bradycardia into the 30s prior to vasovagal event with nausea- EP consulted 6/13  feel event monitor most appropriate action at this time and will need to be ordered on discharge Do not resume beta-blocker-marked as allergy Distal left tib-fib fracture LLE Defer to orthopedics-tramadol for moderate pain- seems controlled Nonweightbearing left leg I have CCed the operating physician Dr. Careful gabapentin-continue 800 mg dosing UTI? Follow BC x 2 blood culture X2 negative--Urine culture not performed --UA suggestive of cystitis Flomax 0.4 daily Bactrim DC 6/12 with no recurrence of fever HTN Amlodipine increased from 5-->10 qam. CABG CAD 1993 Plavix 75 daily, no BB Consider OP ARB AKI superimposed on CKD 3B Initially his Monday Wednesday Friday Lasix 40 mg were held Given mild hyponatremia etc. we have  resumed it   Discharge Exam: Vitals:   07/17/20 1157  07/17/20 1718  BP: (!) 142/52 (!) 129/49  Pulse: 73 69  Resp: 18 16  Temp: 98.1 F (36.7 C) 98 F (36.7 C)  SpO2: 94% 95%    Subj on day of d/c   Seen earlier today no new reports of nausea vomiting from nursing staff  General Exam on discharge  As per exam earlier today-this examination will be completed by discharging physician  Discharge Instructions   Discharge Instructions     Diet - low sodium heart healthy   Complete by: As directed    Increase activity slowly   Complete by: As directed    No wound care   Complete by: As directed       Allergies as of 07/17/2020       Reactions   Beta Adrenergic Blockers Other (See Comments)   Severe bradycardia on 07/2020 admission requiring pacemaker   Codeine Other (See Comments)   Unknown reaction   Penicillins Other (See Comments)   Unknown reaction        Medication List     STOP taking these medications    propranolol 20 MG tablet Commonly known as: INDERAL       TAKE these medications    acetaminophen 500 MG tablet Commonly known as: TYLENOL Take 500 mg by mouth See admin instructions. Take one tablet (500 mg) by mouth three times daily - morning, supper and bedtime   amLODipine 5 MG tablet Commonly known as: NORVASC Take 5 mg by mouth every morning.   atorvastatin 20 MG tablet Commonly known as: LIPITOR Take 20 mg by mouth every morning.   cetirizine 10 MG tablet Commonly known as: ZYRTEC Take 10 mg by mouth at bedtime.   clopidogrel 75 MG tablet Commonly known as: PLAVIX Take 75 mg by mouth every morning.   colchicine 0.6 MG tablet Take 1 tablet (0.6 mg total) by mouth daily. Start taking on: July 18, 2020   donepezil 10 MG tablet Commonly known as: ARICEPT Take 10 mg by mouth 2 (two) times daily.   Fish Oil 1000 MG Caps Take 1,000 mg by mouth See admin instructions. Take one capsule (1000 mg) by mouth three times daily -  morning, supper and bedtime   furosemide 40 MG tablet Commonly known as: LASIX Take 40 mg by mouth every Monday, Wednesday, and Friday.   gabapentin 400 MG capsule Commonly known as: NEURONTIN Take 800 mg by mouth daily after supper.   melatonin 3 MG Tabs tablet Take 1 tablet (3 mg total) by mouth at bedtime as needed.   METAMUCIL PO Take 45 mLs by mouth See admin instructions. Mix 3 tbsp (45 ml) in 18 oz water and drink nightly (with 1 tbsp miralax powder)   MIRALAX PO Take 15 mLs by mouth See admin instructions. Mix 1 tbsp (15 ml) in 18 oz water and drink nightly (with 3 tbsp psyllium fiber)   montelukast 10 MG tablet Commonly known as: SINGULAIR Take 10 mg by mouth every morning.   multivitamin with minerals Tabs tablet Take 1 tablet by mouth every morning.   pantoprazole 40 MG tablet Commonly known as: PROTONIX Take 1 tablet (40 mg total) by mouth daily at 6 (six) AM. Start taking on: July 18, 2020   primidone 50 MG tablet Commonly known as: MYSOLINE Take 0.5 tablets (25 mg total) by mouth at bedtime.   senna-docusate 8.6-50 MG tablet Commonly known as: Senokot-S Take 1 tablet by mouth at bedtime as needed for mild constipation or moderate  constipation.   tamsulosin 0.4 MG Caps capsule Commonly known as: FLOMAX Take 0.4 mg by mouth at bedtime.   traMADol 50 MG tablet Commonly known as: ULTRAM Take 1 tablet (50 mg total) by mouth every 12 (twelve) hours as needed for moderate pain.   vitamin B-12 500 MCG tablet Commonly known as: CYANOCOBALAMIN Take 1,000 mcg by mouth every morning.       Allergies  Allergen Reactions   Beta Adrenergic Blockers Other (See Comments)    Severe bradycardia on 07/2020 admission requiring pacemaker   Codeine Other (See Comments)    Unknown reaction   Penicillins Other (See Comments)    Unknown reaction      The results of significant diagnostics from this hospitalization (including imaging, microbiology, ancillary and  laboratory) are listed below for reference.    Significant Diagnostic Studies: DG Ankle Complete Left  Result Date: 07/09/2020 CLINICAL DATA:  Left ankle fracture EXAM: LEFT ANKLE COMPLETE - 3+ VIEW COMPARISON:  CT and radiograph 07/08/2020 FINDINGS: Unchanged alignment of the minimally displaced distal tibia and fibular fractures in splint. Diffuse osteopenia. Unchanged dense joint distension of the tibiotalar joint. IMPRESSION: Unchanged alignment of the minimally displaced distal tibia and fibular fractures in splint. Diffuse osteopenia. Electronically Signed   By: Caprice Renshaw   On: 07/09/2020 09:41   CT ANKLE LEFT WO CONTRAST  Result Date: 07/08/2020 CLINICAL DATA:  Distal tibial fracture. EXAM: CT OF THE LEFT ANKLE WITHOUT CONTRAST TECHNIQUE: Multidetector CT imaging of the left ankle was performed according to the standard protocol. Multiplanar CT image reconstructions were also generated. COMPARISON:  Radiograph 07/08/2020 and CT scan 08/31/2017 FINDINGS: Bones/Joint/Cartilage Acute oblique fracture extending from the medial distal tibial diaphysis into the lateral distal tibial metaphysis along the margin of the proximal tibiofibular articulation. Nondisplaced transverse fracture of the distal fibula metaphysis. No definite fracture involvement of the tibial plafond and. There is some chronic deformity of the medial malleolus not appreciably changed from 08/31/2017 probably related to an old injury. Abnormal hyperdense nodularity in the tibiotalar joint, talofibular joint, and distal tibiofibular articulation as shown on the 08/31/2017 exam, probably reflecting pigmented villonodular synovitis, and associated with some subtle erosive findings. Degenerative loss of articular space along the subtalar joint. Ligaments Suboptimally assessed by CT. Muscles and Tendons No tendon entrapment. Severe atrophy of the visualized musculature in the mid and lower calf. Soft tissues Subcutaneous edema along the  distal calf and dorsally in the ankle extending into the foot. Atherosclerotic vascular calcifications. Clips along the medial calf likely from prior saphenous vein harvest. IMPRESSION: 1. Acute oblique fracture extending from the medial distal tibial diaphysis to the lateral distal tibial metaphysis. 2. Acute nondisplaced transverse fracture of the distal fibular metaphysis. 3. Chronic hyperdense nodularity in the ankle joint compatible with pigmented villonodular synovitis. 4. Severe muscular atrophy in the calf. 5. Subcutaneous edema in the distal calf, ankle, and dorsal foot. 6. Atherosclerosis. Electronically Signed   By: Gaylyn Rong M.D.   On: 07/08/2020 15:25   DG Chest Port 1 View  Result Date: 07/07/2020 CLINICAL DATA:  Hypertension and syncope EXAM: PORTABLE CHEST 1 VIEW COMPARISON:  July 06, 2020 FINDINGS: Apparent pacemaker lead from right jugular approach with tip in region of right ventricle. No edema or airspace opacity. Heart is upper normal in size with pulmonary vascularity normal. Patient is status post coronary artery bypass grafting. There is aortic atherosclerosis. No pneumothorax. No bone lesions. IMPRESSION: Apparent pacemaker lead tip in right ventricle region. No pneumothorax. No edema  or airspace opacity. Heart upper normal in size with postoperative changes. Aortic Atherosclerosis (ICD10-I70.0). Electronically Signed   By: Bretta Bang III M.D.   On: 07/07/2020 08:26   DG Knee Complete 4 Views Right  Result Date: 07/11/2020 CLINICAL DATA:  Right knee pain and swelling, fall EXAM: RIGHT KNEE - COMPLETE 4+ VIEW COMPARISON:  None. FINDINGS: Four view radiograph right knee demonstrates normal alignment. No fracture or dislocation. Joint spaces are not optimally profiled but appear preserved. Moderate right knee effusion is present. Vascular calcifications are seen within the posterior soft tissues. IMPRESSION: Moderate effusion.  No fracture or dislocation. Electronically  Signed   By: Helyn Numbers MD   On: 07/11/2020 03:52   DG Ankle Left Port  Result Date: 07/08/2020 CLINICAL DATA:  Pain and swelling EXAM: PORTABLE LEFT ANKLE - 2 VIEW COMPARISON:  None. FINDINGS: Frontal and lateral views were obtained. Underlying osteoporosis. There is a spiral fracture of the distal tibial diaphysis extending to the level of the diaphysis-metaphysis junction. Alignment near anatomic. No other fracture. There is a joint effusion. There is moderate generalized joint space narrowing. No erosion. Ankle mortise appears intact. There are multiple foci of arterial vascular calcification. IMPRESSION: Osteoporosis. Spiral fracture distal tibia with alignment near anatomic. No other fracture. There is a joint effusion which potentially could indicate superimposed ligamentous injury. Generalized joint space narrowing. Multiple foci of arterial vascular calcification noted. These results will be called to the ordering clinician or representative by the Radiologist Assistant, and communication documented in the PACS or [employer]. Electronically Signed   By: Bretta Bang III M.D.   On: 07/08/2020 12:11   ECHOCARDIOGRAM COMPLETE  Result Date: 07/07/2020    ECHOCARDIOGRAM REPORT   Patient Name:   Frank Avila Date of Exam: 07/07/2020 Medical Rec #:  [id number]        Height:       68.0 in Accession #:    [id number]       Weight:       175.3 lb Date of Birth:  1928-09-16        BSA:          1.932 m Patient Age:    87 years         BP:           121/56 mmHg Patient Gender: M                HR:           58 bpm. Exam Location:  Inpatient Procedure: 2D Echo, Color Doppler and Cardiac Doppler Indications:    2nd Degree Heart Block i44.1  History:        Patient has prior history of Echocardiogram examinations, most                 recent 04/07/2016. CAD. Prior performed at [facility].  Sonographer:    Irving Burton Senior RDCS Referring Phys: [id number] KELLY E ARPS IMPRESSIONS  1. Left ventricular ejection  fraction, by estimation, is 60 to 65%. The left ventricle has normal function. The left ventricle has no regional wall motion abnormalities. Left ventricular diastolic parameters are consistent with Grade II diastolic dysfunction (pseudonormalization). Elevated left atrial pressure.  2. Right ventricular systolic function is normal. The right ventricular size is normal.  3. The mitral valve is normal in structure. No evidence of mitral valve regurgitation. No evidence of mitral stenosis.  4. The aortic valve is normal in structure. Aortic valve regurgitation is not visualized.  Mild aortic valve sclerosis is present, with no evidence of aortic valve stenosis.  5. The inferior vena cava is normal in size with greater than 50% respiratory variability, suggesting right atrial pressure of 3 mmHg. FINDINGS  Left Ventricle: Left ventricular ejection fraction, by estimation, is 60 to 65%. The left ventricle has normal function. The left ventricle has no regional wall motion abnormalities. The left ventricular internal cavity size was normal in size. There is  no left ventricular hypertrophy. Abnormal (paradoxical) septal motion, consistent with RV pacemaker. Left ventricular diastolic parameters are consistent with Grade II diastolic dysfunction (pseudonormalization). Elevated left atrial pressure. Right Ventricle: The right ventricular size is normal. No increase in right ventricular wall thickness. Right ventricular systolic function is normal. Left Atrium: Left atrial size was normal in size. Right Atrium: Right atrial size was normal in size. Pericardium: There is no evidence of pericardial effusion. Mitral Valve: The mitral valve is normal in structure. Mild to moderate mitral annular calcification. No evidence of mitral valve regurgitation. No evidence of mitral valve stenosis. Tricuspid Valve: The tricuspid valve is normal in structure. Tricuspid valve regurgitation is not demonstrated. No evidence of tricuspid  stenosis. Aortic Valve: The aortic valve is normal in structure. Aortic valve regurgitation is not visualized. Mild aortic valve sclerosis is present, with no evidence of aortic valve stenosis. Pulmonic Valve: The pulmonic valve was normal in structure. Pulmonic valve regurgitation is not visualized. No evidence of pulmonic stenosis. Aorta: The aortic root is normal in size and structure. Venous: The inferior vena cava is normal in size with greater than 50% respiratory variability, suggesting right atrial pressure of 3 mmHg. IAS/Shunts: No atrial level shunt detected by color flow Doppler. Additional Comments: A device lead is visualized in the right ventricle.  LEFT VENTRICLE PLAX 2D LVIDd:         3.80 cm  Diastology LVIDs:         2.70 cm  LV e' medial:    5.33 cm/s LV PW:         1.00 cm  LV E/e' medial:  17.9 LV IVS:        1.40 cm  LV e' lateral:   8.38 cm/s LVOT diam:     1.80 cm  LV E/e' lateral: 11.4 LV SV:         56 LV SV Index:   29 LVOT Area:     2.54 cm  RIGHT VENTRICLE RV S prime:     9.36 cm/s TAPSE (M-mode): 1.7 cm LEFT ATRIUM             Index       RIGHT ATRIUM           Index LA diam:        2.40 cm 1.24 cm/m  RA Area:     19.10 cm LA Vol (A2C):   44.8 ml 23.19 ml/m RA Volume:   51.10 ml  26.45 ml/m LA Vol (A4C):   28.5 ml 14.75 ml/m LA Biplane Vol: 38.4 ml 19.87 ml/m  AORTIC VALVE LVOT Vmax:   99.80 cm/s LVOT Vmean:  70.800 cm/s LVOT VTI:    0.219 m  AORTA Ao Root diam: 4.00 cm MITRAL VALVE MV Area (PHT): 2.90 cm     SHUNTS MV Decel Time: 262 msec     Systemic VTI:  0.22 m MV E velocity: 95.50 cm/s   Systemic Diam: 1.80 cm MV A velocity: 101.00 cm/s MV E/A ratio:  0.95 Mihai Croitoru MD Electronically  signed by Thurmon FairMihai Croitoru MD Signature Date/Time: 07/07/2020/9:10:05 AM    Final     Microbiology: Recent Results (from the past 240 hour(s))  Culture, blood (Routine X 2) w Reflex to ID Panel     Status: None   Collection Time: 07/10/20  8:02 AM   Specimen: BLOOD  Result Value Ref  Range Status   Specimen Description BLOOD BLOOD RIGHT HAND  Final   Special Requests   Final    BOTTLES DRAWN AEROBIC AND ANAEROBIC Blood Culture adequate volume   Culture   Final    NO GROWTH 5 DAYS Performed at [facility], [address] [place], [place] [address]    Report Status 07/15/2020 FINAL  Final  Anaerobic culture w Gram Stain     Status: None   Collection Time: 07/11/20 11:16 AM   Specimen: Synovium; Synovial Fluid  Result Value Ref Range Status   Specimen Description SYNOVIAL FLUID  Final   Special Requests RIGHT KNEE  Final   Gram Stain NO WBC SEEN NO ORGANISMS SEEN   Final   Culture   Final    NO ANAEROBES ISOLATED Performed at [facility], [address] [place], [place] [address]    Report Status 07/16/2020 FINAL  Final  Body fluid culture w Gram Stain     Status: None   Collection Time: 07/11/20 11:17 AM   Specimen: Synovium; Synovial Fluid  Result Value Ref Range Status   Specimen Description SYNOVIAL FLUID  Final   Special Requests RIGHT KNEE  Final   Gram Stain   Final    RARE WBC PRESENT, PREDOMINANTLY PMN NO ORGANISMS SEEN    Culture   Final    NO GROWTH 3 DAYS Performed at [facility], [address] [place], [place] [address]    Report Status 07/14/2020 FINAL  Final  SARS CORONAVIRUS 2 (TAT 6-24 HRS) Nasopharyngeal Nasopharyngeal Swab     Status: None   Collection Time: 07/16/20  3:20 PM   Specimen: Nasopharyngeal Swab  Result Value Ref Range Status   SARS Coronavirus 2 NEGATIVE NEGATIVE Final    Comment: (NOTE) SARS-CoV-2 target nucleic acids are NOT DETECTED.  The SARS-CoV-2 RNA is generally detectable in upper and lower respiratory specimens during the acute phase of infection. Negative results do not preclude SARS-CoV-2 infection, do not rule out co-infections with other pathogens, and should not be used as the sole basis for treatment or other patient management decisions. Negative results must be combined with  clinical observations, patient history, and epidemiological information. The expected result is Negative.  Fact Sheet for Patients: HairSlick.nohttps://www.fda.gov/media/138098/download  Fact Sheet for Healthcare Providers: quierodirigir.comhttps://www.fda.gov/media/138095/download  This test is not yet approved or cleared by the [place] FDA and  has been authorized for detection and/or diagnosis of SARS-CoV-2 by FDA under an Emergency Use Authorization (EUA). This EUA will remain  in effect (meaning this test can be used) for the duration of the COVID-19 declaration under Se ction 564(b)(1) of the Act, 21 U.S.C. section 360bbb-3(b)(1), unless the authorization is terminated or revoked sooner.  Performed at [facility], [address] [place], [place] [address]      Labs: Basic Metabolic Panel: Recent Labs  Lab 07/12/20 0134 07/13/20 0337 07/14/20 0938 07/15/20 0042  NA 134* 132* 130* 130*  K 4.2 3.8 4.3 4.6  CL 102 100 100 101  CO2 20* 20* 20* 21*  GLUCOSE 114* 106* 138* 121*  BUN 35* 37* 38*  45*  CREATININE 2.13* 1.96* 2.00* 2.18*  CALCIUM 8.1* 7.8* 8.2* 7.9*  PHOS  --   --  3.2 3.3   Liver Function Tests: Recent Labs  Lab 07/12/20 0134 07/13/20 0337 07/14/20 0938 07/15/20 0042  AST 23 45*  --   --   ALT 28 47*  --   --   ALKPHOS 72 94  --   --   BILITOT 0.7 0.6  --   --   PROT 5.9* 5.9*  --   --   ALBUMIN 2.3* 2.2* 2.2* 2.1*   No results for input(s): LIPASE, AMYLASE in the last 168 hours. No results for input(s): AMMONIA in the last 168 hours. CBC: Recent Labs  Lab 07/12/20 0134 07/13/20 0337 07/15/20 0042  WBC 11.1* 10.6* 7.9  NEUTROABS 8.4* 7.7 6.6  HGB 9.8* 9.5* 9.2*  HCT 29.7* 28.5* 27.5*  MCV 94.6 93.8 93.5  PLT 316 332 405*   Cardiac Enzymes: No results for input(s): CKTOTAL, CKMB, CKMBINDEX, TROPONINI in the last 168 hours. BNP: BNP (last 3 results) No results for input(s): BNP in the last 8760 hours.  ProBNP (last 3 results) No results for  input(s): PROBNP in the last 8760 hours.  CBG: Recent Labs  Lab 07/16/20 1151 07/16/20 1551 07/16/20 2045 07/17/20 0615  GLUCAP 95 103* 149* 98       Signed:  Rhetta Mura MD   [facility] 07/17/2020, 6:39 PM

## 2020-07-18 ENCOUNTER — Encounter (HOSPITAL_COMMUNITY)
Admission: AD | Disposition: A | Discharge status: Skilled Nursing Facility | Payer: Self-pay | Source: Other Acute Inpatient Hospital | Attending: Family Medicine

## 2020-07-18 ENCOUNTER — Inpatient Hospital Stay (HOSPITAL_COMMUNITY): Payer: No Typology Code available for payment source

## 2020-07-18 ENCOUNTER — Inpatient Hospital Stay (HOSPITAL_COMMUNITY)
Admission: AD | Admit: 2020-07-18 | Discharge: 2020-07-18 | Disposition: A | Discharge status: Home / Self Care | Payer: No Typology Code available for payment source | Source: Other Acute Inpatient Hospital | Attending: Physician Assistant | Admitting: Physician Assistant

## 2020-07-18 DIAGNOSIS — W19XXXA Unspecified fall, initial encounter: Secondary | ICD-10-CM

## 2020-07-18 DIAGNOSIS — M109 Gout, unspecified: Secondary | ICD-10-CM

## 2020-07-18 DIAGNOSIS — R001 Bradycardia, unspecified: Secondary | ICD-10-CM

## 2020-07-18 LAB — COMPREHENSIVE METABOLIC PANEL
ALT: 28 U/L (ref 0–44)
AST: 22 U/L (ref 15–41)
Albumin: 2.5 g/dL — ABNORMAL LOW (ref 3.5–5.0)
Alkaline Phosphatase: 75 U/L (ref 38–126)
Anion gap: 10 (ref 5–15)
BUN: 47 mg/dL — ABNORMAL HIGH (ref 8–23)
CO2: 21 mmol/L — ABNORMAL LOW (ref 22–32)
Calcium: 8.8 mg/dL — ABNORMAL LOW (ref 8.9–10.3)
Chloride: 101 mmol/L (ref 98–111)
Creatinine, Ser: 2.03 mg/dL — ABNORMAL HIGH (ref 0.61–1.24)
GFR, Estimated: 31 mL/min — ABNORMAL LOW (ref >60)
Glucose, Bld: 97 mg/dL (ref 70–99)
Potassium: 4.3 mmol/L (ref 3.5–5.1)
Sodium: 132 mmol/L — ABNORMAL LOW (ref 135–145)
Total Bilirubin: 0.5 mg/dL (ref 0.3–1.2)
Total Protein: 6.1 g/dL — ABNORMAL LOW (ref 6.5–8.1)

## 2020-07-18 LAB — CBC WITH DIFFERENTIAL/PLATELET
Abs Immature Granulocytes: 0.07 10*3/uL (ref 0.00–0.07)
Basophils Absolute: 0.1 10*3/uL (ref 0.0–0.1)
Basophils Relative: 1 %
Eosinophils Absolute: 0.4 10*3/uL (ref 0.0–0.5)
Eosinophils Relative: 6 %
HCT: 31.4 % — ABNORMAL LOW (ref 39.0–52.0)
Hemoglobin: 10.5 g/dL — ABNORMAL LOW (ref 13.0–17.0)
Immature Granulocytes: 1 %
Lymphocytes Relative: 13 %
Lymphs Abs: 0.9 10*3/uL (ref 0.7–4.0)
MCH: 31.3 pg (ref 26.0–34.0)
MCHC: 33.4 g/dL (ref 30.0–36.0)
MCV: 93.7 fL (ref 80.0–100.0)
Monocytes Absolute: 1 10*3/uL (ref 0.1–1.0)
Monocytes Relative: 13 %
Neutro Abs: 4.8 10*3/uL (ref 1.7–7.7)
Neutrophils Relative %: 66 %
Platelets: 515 10*3/uL — ABNORMAL HIGH (ref 150–400)
RBC: 3.35 MIL/uL — ABNORMAL LOW (ref 4.22–5.81)
RDW: 12.3 % (ref 11.5–15.5)
WBC: 7.2 10*3/uL (ref 4.0–10.5)
nRBC: 0 % (ref 0.0–0.2)

## 2020-07-18 LAB — SARS CORONAVIRUS 2 (TAT 6-24 HRS): SARS Coronavirus 2: NEGATIVE

## 2020-07-18 LAB — GLUCOSE, CAPILLARY: Glucose-Capillary: 115 mg/dL — ABNORMAL HIGH (ref 70–99)

## 2020-07-18 SURGERY — ESOPHAGOGASTRODUODENOSCOPY (EGD) WITH PROPOFOL
Anesthesia: Monitor Anesthesia Care

## 2020-07-18 MED ORDER — SODIUM CHLORIDE 0.9% FLUSH
3.0000 mL | Freq: Two times a day (BID) | INTRAVENOUS | Status: DC
  Administered 2020-07-19 (×2): 3 mL via INTRAVENOUS

## 2020-07-18 MED ORDER — CHLORHEXIDINE GLUCONATE 4 % EX LIQD
60.0000 mL | Freq: Once | CUTANEOUS | Status: AC
  Administered 2020-07-19: 4 via TOPICAL
  Filled 2020-07-18: qty 60

## 2020-07-18 NOTE — Progress Notes (Signed)
Physical Therapy Treatment Patient Details Name: Frank Avila MRN: [id number] DOB: September 22, 1928 Today's Date: 07/18/2020    History of Present Illness 87 yo admitted 6/3 after syncope while in [facility] which pt slid out of WC getting his LLE caught. He was bradycardic with 2nd degree AV block resolved with medication. pt found to have LLE tibia fx treated non operatively. PMhx: HTN, HLD, CAD s/p CABG, spinal muscular dystrophy (diagnosed when he was 87 y/o).    PT Comments    Pt admitted with above diagnosis. Pt was lying in BM on arrival.  Cleaned pt and then performed vestibular assessment. 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 Will continue to follow pt acutely.  Pt currently with functional limitations due to balance and endurance deficits. Pt will benefit from skilled PT to increase their independence and safety with mobility to allow discharge to the venue listed below.      Follow Up Recommendations  SNF     Equipment Recommendations  None recommended by PT    Recommendations for Other Services OT consult     Precautions / Restrictions Precautions Precautions: Fall Precaution Comments: per pt no muscular control in LLE and foot drop in RLE, sometimes poor trunk control Restrictions Weight Bearing Restrictions: Yes LLE Weight Bearing: Non weight bearing    Mobility  Bed Mobility Overal bed mobility: Needs Assistance Bed Mobility: Rolling Rolling: Mod assist         General bed mobility comments: Pt was soiled with BM on arrival.  Had to change all linens and clean pt. Also called nursing in as buttocks with slight abrasion and she put a dressing on the abrasions. Pt tolerated rolling fairly well.    Transfers                    Ambulation/Gait             General Gait Details: pt non ambulatory at baseline   Stairs             Wheelchair Mobility    Modified Rankin (Stroke Patients Only)        Balance                                            Cognition Arousal/Alertness: Awake/alert Behavior During Therapy: WFL for tasks assessed/performed Overall Cognitive Status: Impaired/Different from baseline Area of Impairment: Safety/judgement;Following commands;Attention                   Current Attention Level: Selective   Following Commands: Follows one step commands with increased time;Follows one step commands consistently Safety/Judgement: Decreased awareness of safety;Decreased awareness of deficits     General Comments: Pt with improving insight into deficits, improved attention to task this date; pleasant and participatory - follows all commands (increased time or repetition due to [facility])      Exercises Other Exercises Other Exercises: x 1 exercises side to side.    General Comments General comments (skin integrity, edema, etc.): Once pt was cleaned, assessed pt for vestibular hypofunction which pt does appear to have a hypofunction on right.  Initiated x 1 exercises and pt understands to perform 3 reps at least every other hour.      Pertinent Vitals/Pain Pain Assessment: Faces Faces Pain Scale: Hurts little more Pain Location: R knee Pain Descriptors /  Indicators: Sore Pain Intervention(s): Limited activity within patient's tolerance;Monitored during session;Repositioned    Home Living                      Prior Function            PT Goals (current goals can now be found in the care plan section) Acute Rehab PT Goals Patient Stated Goal: go to [facility] for rehab then return home Progress towards PT goals: Progressing toward goals    Frequency    Min 3X/week      PT Plan Current plan remains appropriate    Co-evaluation              AM-PAC PT "6 Clicks" Mobility   Outcome Measure  Help needed turning from your back to your side while in a flat bed without using bedrails?: A Lot Help needed moving from  lying on your back to sitting on the side of a flat bed without using bedrails?: A Lot Help needed moving to and from a bed to a chair (including a wheelchair)?: A Lot Help needed standing up from a chair using your arms (e.g., wheelchair or bedside chair)?: Total Help needed to walk in hospital room?: Total Help needed climbing 3-5 steps with a railing? : Total 6 Click Score: 9    End of Session   Activity Tolerance: Patient limited by fatigue Patient left: in bed;with call bell/phone within reach;with bed alarm set;Other (comment) (heels floated, pillows under BUE, pt on right side) Nurse Communication: Mobility status PT Visit Diagnosis: Other abnormalities of gait and mobility (R26.89);Muscle weakness (generalized) (M62.81)     Time: 0017-4944 PT Time Calculation (min) (ACUTE ONLY): 33 min  Charges:  $Therapeutic Exercise: 8-22 mins $Self Care/Home Management: 8-22                     [facility] M,PT Acute Rehab Services [phone] (pager)    Bevelyn Buckles 07/18/2020, 2:07 PM

## 2020-07-18 NOTE — Progress Notes (Signed)
Mobility Specialist: Progress Note   07/18/20 1151  Mobility  Activity Transferred:  Bed to chair  Level of Assistance Moderate assist, patient does 50-74%  Assistive Device Sliding board  Mobility Out of bed to chair with meals  Mobility Response Tolerated well  Mobility performed by Mobility specialist  Bed Position Chair  $Mobility charge 1 Mobility   Pt was assisted to the recliner utilizing the sliding board. Pt was modA to scoot to the chair but was able to maintain trunk balance independently during transfer. Pt is sitting up in the recliner, RN present in the room at the end of the session.   [facility] Eli Pattillo Mobility Specialist Mobility Specialist Phone: [phone]

## 2020-07-18 NOTE — Progress Notes (Signed)
In d/w Dr. Graciela Husbands, plan will be to place a ZIO AT wearable monitor at time of discharge. (Not a loop implant) Please make EP service aware once bed placement is established and discharge is known and we will arrange monitor and EP follow up.  Thanks Francis Dowse, PA-C

## 2020-07-18 NOTE — Progress Notes (Addendum)
Called by nurse that patient had a near syncopal event associated with bradycardia  As I enter the room the patient is back in bed, somewhat pale though this is his baseline Skin is warm and dry He reports feeling weak and bad, his HR 50s sinus and BP 110's-120'/40'-50's He is AAO follows commands and answers appropriately  In d/w EKG tech who was in the room for the Zio application  As she entered the room she asked how he was and he mentioned he was not feeling well and reported feeling nauseous, she handed him a basin and then a blue bag and he started to become near syncopal and the alarms went off she ted his HR read zero. Called for help and got him back to bed  In d/w RN he never had LOC  but was very weak and slow to answer initially, was diaphoretic.  In review of telemetry HR had been SB/SR 50's-60's and at the time of the event he had an 11 second pause, with baseline artifact unable to appreciate P waves or PR this appeared to be a sinus pause HR returned SB 30s >back to the 50's  He remains Sb/SR 50's-60's bp 120/58.   Cards MD to beside for additional evaluation Pt is warm, perfusing and following commands, not felt to need transfer or additional urgent intervention Still not feeling very well, is weak, but no longer nauseous  VSS stable Pacer pad on patient Discussed with the patient that we will not be discharging him and will need to revisit PPM implant.  Discussed with his friends who came to visit him as well  I have made Dr. Graciela Husbands aware, again with preceding nausea is likely secondary though will need pacing at this juncture  Made attending aware.  I will hold him NPO after MN.  Francis Dowse, PA-C

## 2020-07-18 NOTE — Progress Notes (Signed)
Pt became diaphoretic, drooling and pale with a pulse of 38 during placement of ZIO, sitting in reclining chair. Charge nurse notified. Rapid response notified. PA-C notified.  Kenard Gower, RN

## 2020-07-18 NOTE — TOC Progression Note (Addendum)
Transition of Care [facility]) - Progression Note    Patient Details  Name: Frank Avila MRN: [id number] Date of Birth: 1928-08-16  Transition of Care [facility]) CM/SW Hind[place], [place]illi[place] Phone Number: 07/18/2020, 11:04 AM  Clinical Narrative:     CSW attempted to start auth with online [employer] portal. Portal not working. CSW called [employer] and confirmed pt is managed by them. CSW started auth with pending facility. Ref# [id number]. Clinicals faxed to [phone]. Facility will need to be added to [place] once identified.   CSW called [place] with [employer]; left message informing that pt has [employer] and that [facility]m Kaufmann has been started. CSW requested call back regarding decision if they could accept pt.   1230: CSW received call from [place] who explained [facility] would not be able to accept due to "nursing team cannot extend a bed offer at this time." [facility] cannot offer due to insurance not in network. [facility] can accept pt.   CSW notified pt niece, Frank Avila, who prefers pt to go to SNF in [place]. CSW met with pt; he explains [facility] is not his first choice but since other facilities in [place] offer, he prefers [facility] over [place] facilities.   1445: Auth approved from 6/15 -- 6/17. Care coordinator will be Angelica Chessman. Ref# [id number]. Pt can DC once Zio patch is placed. EP notified.   1530: DC cancelled for today due to clinical changes per EP. CSW notified [facility] and pt's Niece Frank Avila  Expected Discharge Plan: [place] Barriers to Discharge: Continued Medical Work up  Expected Discharge Plan and Services Expected Discharge Plan: [place] In-house Referral: Clinical Social Work     Living arrangements for the past 2 months: Single Family Home Expected Discharge Date: 07/18/20                                     Social Determinants of Health (SDOH) Interventions    Readmission Risk  Interventions No flowsheet data found.

## 2020-07-18 NOTE — Significant Event (Addendum)
Rapid Response Event Note   Reason for Call :  Symptomatic bradycardia  Initial Focused Assessment:  Pt was up to chair getting his ZIO patch placed when he complained of not feeling well. Review of telemetry monitor revealed a sinus pause at 14:54 lasting approximately 11.34 seconds, preceded and followed by SB with BBB. He became pale, diaphoretic, and hot to touch. Staff used the lift to get pt back to bed. He continued complained of feeling unwell and nausea, despite HR returning to the 50s.  VS: BP 107/44, HR 46, RR 18, SpO2 100% on 2LNC  Interventions:  -CBG: 115  Plan of Care:  -Discharge cancelled -External pacing pads placed, EKG leads changed  Call rapid response for additional needs  Event Summary:  MD Notified: Luster Landsberg, PA Call Time: 1503 Arrival Time: 1506 End Time: 1550  Jennye Moccasin, RN

## 2020-07-18 NOTE — Progress Notes (Signed)
Progress Note  Patient Name: Frank Avila Date of Encounter: 07/18/2020  [facility] Cardiologist: Dr. Judithe Modest  Subjective   Without complaints of chest pain or SOB; lying in bed confused as to plans   Inpatient Medications    Scheduled Meds:  amLODipine  10 mg Oral Daily   atorvastatin  20 mg Oral Daily   Chlorhexidine Gluconate Cloth  6 each Topical Daily   clopidogrel  75 mg Oral Daily   colchicine  0.6 mg Oral Daily   donepezil  10 mg Oral BID   furosemide  40 mg Oral Daily   gabapentin  800 mg Oral QPC supper   montelukast  10 mg Oral Daily   pantoprazole  40 mg Oral Q0600   polyethylene glycol  17 g Oral Daily   primidone  25 mg Oral QHS   psyllium  1 packet Oral Daily   sodium chloride flush  3 mL Intravenous Q12H   tamsulosin  0.4 mg Oral QHS   vitamin B-12  1,000 mcg Oral Daily   Continuous Infusions:  sodium chloride     PRN Meds: sodium chloride, acetaminophen, lidocaine, melatonin, metoCLOPramide, prochlorperazine, senna-docusate, sodium chloride flush, traMADol   Vital Signs    Vitals:   07/17/20 2345 07/18/20 0335 07/18/20 1004 07/18/20 1027  BP: 132/61 (!) 141/62 (!) 133/47 (!) 160/56  Pulse: 61 62 84   Resp: 17 16 17    Temp: 98 F (36.7 C) 98.6 F (37 C) 98.4 F (36.9 C)   TempSrc: Oral Oral Oral   SpO2: 95% 95% 96%   Weight:      Height:        Intake/Output Summary (Last 24 hours) at 07/18/2020 1055 Last data filed at 07/18/2020 0145 Gross per 24 hour  Intake --  Output 2150 ml  Net -2150 ml    Last 3 Weights 07/11/2020 07/09/2020 07/08/2020  Weight (lbs) 164 lb 10.9 oz 164 lb 7.4 oz 170 lb 13.7 oz  Weight (kg) 74.7 kg 74.6 kg 77.5 kg      Telemetry  Sinus without pauses- Personally Reviewed  ECG    No new EKGs - Personally Reviewed  Physical Exam  Well developed and nourished in no acute distress HENT normal Neck supple with JVP-  flat  Clear Regular rate and rhythm, no murmurs or gallops Abd-soft with active BS No  Clubbing cyanosis edema Skin-warm and dry A & Oriented       Labs    High Sensitivity Troponin:  No results for input(s): TROPONINIHS in the last 720 hours.    Chemistry Recent Labs  Lab 07/12/20 0134 07/13/20 0337 07/14/20 0938 07/15/20 0042 07/18/20 0202  NA 134* 132* 130* 130* 132*  K 4.2 3.8 4.3 4.6 4.3  CL 102 100 100 101 101  CO2 20* 20* 20* 21* 21*  GLUCOSE 114* 106* 138* 121* 97  BUN 35* 37* 38* 45* 47*  CREATININE 2.13* 1.96* 2.00* 2.18* 2.03*  CALCIUM 8.1* 7.8* 8.2* 7.9* 8.8*  PROT 5.9* 5.9*  --   --  6.1*  ALBUMIN 2.3* 2.2* 2.2* 2.1* 2.5*  AST 23 45*  --   --  22  ALT 28 47*  --   --  28  ALKPHOS 72 94  --   --  75  BILITOT 0.7 0.6  --   --  0.5  GFRNONAA 29* 32* 31* 28* 31*  ANIONGAP 12 12 10 8 10       Hematology Recent Labs  Lab  07/13/20 0337 07/15/20 0042 07/18/20 0202  WBC 10.6* 7.9 7.2  RBC 3.04* 2.94* 3.35*  HGB 9.5* 9.2* 10.5*  HCT 28.5* 27.5* 31.4*  MCV 93.8 93.5 93.7  MCH 31.3 31.3 31.3  MCHC 33.3 33.5 33.4  RDW 12.3 12.1 12.3  PLT 332 405* 515*     BNPNo results for input(s): BNP, PROBNP in the last 168 hours.   DDimer No results for input(s): DDIMER in the last 168 hours.   Radiology    No results found.  Cardiac Studies   2D echo 07/2020 IMPRESSIONS   1. Left ventricular ejection fraction, by estimation, is 60 to 65%. The  left ventricle has normal function. The left ventricle has no regional  wall motion abnormalities. Left ventricular diastolic parameters are  consistent with Grade II diastolic  dysfunction (pseudonormalization). Elevated left atrial pressure.   2. Right ventricular systolic function is normal. The right ventricular  size is normal.   3. The mitral valve is normal in structure. No evidence of mitral valve  regurgitation. No evidence of mitral stenosis.   4. The aortic valve is normal in structure. Aortic valve regurgitation is  not visualized. Mild aortic valve sclerosis is present, with no evidence   of aortic valve stenosis.   5. The inferior vena cava is normal in size with greater than 50%  respiratory variability, suggesting right atrial pressure of 3 mmHg.  Patient Profile     87 y.o. male male w/PMHx of CAD (CABG 1993, PCI to grafts 2012), HTN, HLD, spinal muscular dystrophy (uses motorized wheelchair)   Initially admitted to [facility] via [facility] where he was for syncope, he was found bradycardic in the 20's with reports of V bigemeny and other notes report some degree of AVblock described as second degree AVblock and reports of V bigemeny, in the environment of newly started propanolol (for tremor), temp pacing wire was placed, he was also found to have L tib-fib fx   Syncopal episode described as started to feel poorly becaiu=me nauseous, diaphoretic, vomited and fainted.   TTE noted LVEF 60-65%   Cardiology was consulted and his propanolol stopped EP also consulted seen by Dr. Ladona Ridgel at the time of his consult 07/07/20 he had regained conduction noting baseline RBBB, and suspected that he would not need pacing off BB tx   Started on primidone for his tremor Temp wire removed 07/08/20   Cardiology signed off 07/10/20 with no further bradycardia Recalled 07/13/20 with 2 transient bradycardic episodes, though had been nauseous all day and at least once with emesis and associated normal rates. We briefly discussed the case and in the environment of nausea suspected to have been vagal and monitoring for recurrent bradycardia without a potential tirigger was planned.   Cardiology saw the patient, note reports case discussed with prior cardiologist following and EP with no indication for pacing though it seems at patient request or demand that he get a PPM implant and EP is asked to revisit  Assessment & Plan    Bradycardia - persumed 2/2 via cardiac inhibition and neurally mediated reflex  Nausea  Spinal motor weakness    Will discharge with ZIO monitor  Appreciate GI  help Need to get pt OOB to prevent weakness     For questions or updates, please contact [facility] Please consult [email or web address] for contact info under        Signed, Sherryl Manges, MD  07/18/2020, 10:55 AM     Discussed with hospitalists regarding GI consultation  for purpose of understanding nausea whichseems to be the trigger for his brady events  Would monitor at discharge.

## 2020-07-18 NOTE — Discharge Summary (Signed)
Physician Discharge Summary  Denton BrickWalter M Godsey UEA:[id number] DOB: [birth year] DOA: 07/06/2020  PCP: No primary care provider on file.  Admit date: 07/06/2020 Discharge date: 07/18/2020  Admitted From: Home  Disposition:  SNF   Recommendations for Outpatient Follow-up:  Follow-up Gastroenterology Dr. Meridee ScoreMansouraty in 2-4 weeks for nausea  Follow up with Electrophysiology Dr. Graciela HusbandsKlein as directed Needs Chem-12, magnesium, CBC in about 1 week at facility Will need follow-up with Ortho trauma specialists, Mr. Montez MoritaKeith Paul, PA         Home Health: N/A  Equipment/Devices: TBD at SNF  Discharge Condition: Improving  CODE STATUS: FULL Diet recommendation: Low sodium  Brief/Interim Summary: Mr. Raynelle FanningStrider is a 87 year old M with spinal muscular dystrophy, wheelchair-bound, lives independently, HTN, CAD status post CABG 1993, PCI 2012, and hyperlipidemia who presented with fall.  When he developed lightheadedness, passed out woke up on the ground.  In the ER at [facility], bradycardic, hypotensive pulses 20s and 30s, given atropine and a transvenous pacing wire was placed, found to have left ankle fracture.       PRINCIPAL HOSPITAL DIAGNOSIS: Symptomatic bradycardia    Discharge Diagnoses:  Bradycardia Propranolol held, the transvenous pacer was maintained, and his bradycardia resolved.    The patient did have 2 further transient bradycardic episodes during the hospitalization in the setting of nausea and emesis, electrophysiology were consulted again, did not feel that pacemaker was clearly indicated and the patient was discharged with a Zio patch and electrophysiology outpatient follow-up.    Nausea H. Pylori not obtained by time of discharge.  Has follow up planned with Dr. Meridee ScoreMansouraty for chronic nausea.    Acute gout flare Patient noted to have right knee swelling redness and pain, arthrocentesis performed MSU crystals visualized, and he was discharged on  colchicine.   Left tib-fib fracture This was present on admission, patient went to the OR with Dr. Marcello FennelHande, had ORIF, no postoperative complications, nonweightbearing left leg, follow-up with orthopedic as an outpatient.   Possible UTI Patient was treated with Bactrim, no recurrence of fever.  Essential tremor Propranolol stopped, primidone continued.   Coronary artery disease Hypertension  CKD stage IIIb AKI ruled out    Discharge Instructions  Discharge Instructions     Diet - low sodium heart healthy   Complete by: As directed    Increase activity slowly   Complete by: As directed    No wound care   Complete by: As directed       Allergies as of 07/18/2020       Reactions   Beta Adrenergic Blockers Other (See Comments)   Severe bradycardia on 07/2020 admission requiring pacemaker   Codeine Other (See Comments)   Unknown reaction   Penicillins Other (See Comments)   Unknown reaction        Medication List     STOP taking these medications    propranolol 20 MG tablet Commonly known as: INDERAL       TAKE these medications    acetaminophen 500 MG tablet Commonly known as: TYLENOL Take 500 mg by mouth See admin instructions. Take one tablet (500 mg) by mouth three times daily - morning, supper and bedtime   amLODipine 5 MG tablet Commonly known as: NORVASC Take 5 mg by mouth every morning.   atorvastatin 20 MG tablet Commonly known as: LIPITOR Take 20 mg by mouth every morning.   cetirizine 10 MG tablet Commonly known as: ZYRTEC Take 10 mg by mouth at bedtime.   clopidogrel 75 MG  tablet Commonly known as: PLAVIX Take 75 mg by mouth every morning.   colchicine 0.6 MG tablet Take 1 tablet (0.6 mg total) by mouth daily.   donepezil 10 MG tablet Commonly known as: ARICEPT Take 10 mg by mouth 2 (two) times daily.   Fish Oil 1000 MG Caps Take 1,000 mg by mouth See admin instructions. Take one capsule (1000 mg) by mouth three times daily -  morning, supper and bedtime   furosemide 40 MG tablet Commonly known as: LASIX Take 40 mg by mouth every Monday, Wednesday, and Friday.   gabapentin 400 MG capsule Commonly known as: NEURONTIN Take 800 mg by mouth daily after supper.   melatonin 3 MG Tabs tablet Take 1 tablet (3 mg total) by mouth at bedtime as needed.   METAMUCIL PO Take 45 mLs by mouth See admin instructions. Mix 3 tbsp (45 ml) in 18 oz water and drink nightly (with 1 tbsp miralax powder)   MIRALAX PO Take 15 mLs by mouth See admin instructions. Mix 1 tbsp (15 ml) in 18 oz water and drink nightly (with 3 tbsp psyllium fiber)   montelukast 10 MG tablet Commonly known as: SINGULAIR Take 10 mg by mouth every morning.   multivitamin with minerals Tabs tablet Take 1 tablet by mouth every morning.   pantoprazole 40 MG tablet Commonly known as: PROTONIX Take 1 tablet (40 mg total) by mouth daily at 6 (six) AM.   primidone 50 MG tablet Commonly known as: MYSOLINE Take 0.5 tablets (25 mg total) by mouth at bedtime.   senna-docusate 8.6-50 MG tablet Commonly known as: Senokot-S Take 1 tablet by mouth at bedtime as needed for mild constipation or moderate constipation.   tamsulosin 0.4 MG Caps capsule Commonly known as: FLOMAX Take 0.4 mg by mouth at bedtime.   traMADol 50 MG tablet Commonly known as: ULTRAM Take 1 tablet (50 mg total) by mouth every 12 (twelve) hours as needed for moderate pain.   vitamin B-12 500 MCG tablet Commonly known as: CYANOCOBALAMIN Take 1,000 mcg by mouth every morning.        Follow-up Information     Myrene Galas, MD. Schedule an appointment as soon as possible for a visit.   Specialty: Orthopedic Surgery Contact information: [address] [place] [address] [phone]         Duke Salvia, MD. Schedule an appointment as soon as possible for a visit in 1 month(s).   Specialty: Cardiology Contact information: [address] [place] [address] [phone]         Mansouraty, Netty Starring., MD. Schedule an appointment as soon as possible for a visit in 1 month(s).   Specialties: Gastroenterology, Internal Medicine Contact information: [address] [place] [address] [phone]                Allergies  Allergen Reactions   Beta Adrenergic Blockers Other (See Comments)    Severe bradycardia on 07/2020 admission requiring pacemaker   Codeine Other (See Comments)    Unknown reaction   Penicillins Other (See Comments)    Unknown reaction    Consultations: Cardiology Electrophysiology Gastroenterology Orthopedics   Procedures/Studies: DG Ankle Complete Left  Result Date: 07/09/2020 CLINICAL DATA:  Left ankle fracture EXAM: LEFT ANKLE COMPLETE - 3+ VIEW COMPARISON:  CT and radiograph 07/08/2020 FINDINGS: Unchanged alignment of the minimally displaced distal tibia and fibular fractures in splint. Diffuse osteopenia. Unchanged dense joint distension of the tibiotalar joint. IMPRESSION:  Unchanged alignment of the minimally displaced distal tibia and fibular fractures in splint. Diffuse osteopenia. Electronically Signed   By: Caprice Renshaw   On: 07/09/2020 09:41   CT ANKLE LEFT WO CONTRAST  Result Date: 07/08/2020 CLINICAL DATA:  Distal tibial fracture. EXAM: CT OF THE LEFT ANKLE WITHOUT CONTRAST TECHNIQUE: Multidetector CT imaging of the left ankle was performed according to the standard protocol. Multiplanar CT image reconstructions were also generated. COMPARISON:  Radiograph 07/08/2020 and CT scan 08/31/2017 FINDINGS: Bones/Joint/Cartilage Acute oblique fracture extending from the medial distal tibial diaphysis into the lateral distal tibial metaphysis along the margin of the proximal tibiofibular articulation. Nondisplaced transverse fracture of the distal fibula metaphysis. No definite fracture involvement of the tibial plafond and. There is some chronic deformity of the medial malleolus  not appreciably changed from 08/31/2017 probably related to an old injury. Abnormal hyperdense nodularity in the tibiotalar joint, talofibular joint, and distal tibiofibular articulation as shown on the 08/31/2017 exam, probably reflecting pigmented villonodular synovitis, and associated with some subtle erosive findings. Degenerative loss of articular space along the subtalar joint. Ligaments Suboptimally assessed by CT. Muscles and Tendons No tendon entrapment. Severe atrophy of the visualized musculature in the mid and lower calf. Soft tissues Subcutaneous edema along the distal calf and dorsally in the ankle extending into the foot. Atherosclerotic vascular calcifications. Clips along the medial calf likely from prior saphenous vein harvest. IMPRESSION: 1. Acute oblique fracture extending from the medial distal tibial diaphysis to the lateral distal tibial metaphysis. 2. Acute nondisplaced transverse fracture of the distal fibular metaphysis. 3. Chronic hyperdense nodularity in the ankle joint compatible with pigmented villonodular synovitis. 4. Severe muscular atrophy in the calf. 5. Subcutaneous edema in the distal calf, ankle, and dorsal foot. 6. Atherosclerosis. Electronically Signed   By: Gaylyn Rong M.D.   On: 07/08/2020 15:25   DG Chest Port 1 View  Result Date: 07/07/2020 CLINICAL DATA:  Hypertension and syncope EXAM: PORTABLE CHEST 1 VIEW COMPARISON:  July 06, 2020 FINDINGS: Apparent pacemaker lead from right jugular approach with tip in region of right ventricle. No edema or airspace opacity. Heart is upper normal in size with pulmonary vascularity normal. Patient is status post coronary artery bypass grafting. There is aortic atherosclerosis. No pneumothorax. No bone lesions. IMPRESSION: Apparent pacemaker lead tip in right ventricle region. No pneumothorax. No edema or airspace opacity. Heart upper normal in size with postoperative changes. Aortic Atherosclerosis (ICD10-I70.0).  Electronically Signed   By: Bretta Bang III M.D.   On: 07/07/2020 08:26   DG Knee Complete 4 Views Right  Result Date: 07/11/2020 CLINICAL DATA:  Right knee pain and swelling, fall EXAM: RIGHT KNEE - COMPLETE 4+ VIEW COMPARISON:  None. FINDINGS: Four view radiograph right knee demonstrates normal alignment. No fracture or dislocation. Joint spaces are not optimally profiled but appear preserved. Moderate right knee effusion is present. Vascular calcifications are seen within the posterior soft tissues. IMPRESSION: Moderate effusion.  No fracture or dislocation. Electronically Signed   By: Helyn Numbers MD   On: 07/11/2020 03:52   DG Tibia/Fibula Left Port  Result Date: 07/18/2020 CLINICAL DATA:  Left lower leg fracture EXAM: PORTABLE LEFT TIBIA AND FIBULA - 2 VIEW COMPARISON:  07/09/2020 FINDINGS: Fracture distal tibia with mild angulation and mild displacement unchanged from the prior study. No fracture of the fibula. Casting of the lower leg. IMPRESSION: Mildly displaced and angulated fracture distal tibia unchanged from the prior examination. Electronically Signed   By: Marlan Palau M.D.  On: 07/18/2020 11:42   DG Ankle Left Port  Result Date: 07/08/2020 CLINICAL DATA:  Pain and swelling EXAM: PORTABLE LEFT ANKLE - 2 VIEW COMPARISON:  None. FINDINGS: Frontal and lateral views were obtained. Underlying osteoporosis. There is a spiral fracture of the distal tibial diaphysis extending to the level of the diaphysis-metaphysis junction. Alignment near anatomic. No other fracture. There is a joint effusion. There is moderate generalized joint space narrowing. No erosion. Ankle mortise appears intact. There are multiple foci of arterial vascular calcification. IMPRESSION: Osteoporosis. Spiral fracture distal tibia with alignment near anatomic. No other fracture. There is a joint effusion which potentially could indicate superimposed ligamentous injury. Generalized joint space narrowing. Multiple foci  of arterial vascular calcification noted. These results will be called to the ordering clinician or representative by the Radiologist Assistant, and communication documented in the PACS or [employer]. Electronically Signed   By: Bretta Bang III M.D.   On: 07/08/2020 12:11   ECHOCARDIOGRAM COMPLETE  Result Date: 07/07/2020    ECHOCARDIOGRAM REPORT   Patient Name:   AIDYN KELLIS Date of Exam: 07/07/2020 Medical Rec #:  [id number]        Height:       68.0 in Accession #:    [id number]       Weight:       175.3 lb Date of Birth:  13-Jan-1929        BSA:          1.932 m Patient Age:    87 years         BP:           121/56 mmHg Patient Gender: M                HR:           58 bpm. Exam Location:  Inpatient Procedure: 2D Echo, Color Doppler and Cardiac Doppler Indications:    2nd Degree Heart Block i44.1  History:        Patient has prior history of Echocardiogram examinations, most                 recent 04/07/2016. CAD. Prior performed at [facility].  Sonographer:    Irving Burton Senior RDCS Referring Phys: [id number] KELLY E ARPS IMPRESSIONS  1. Left ventricular ejection fraction, by estimation, is 60 to 65%. The left ventricle has normal function. The left ventricle has no regional wall motion abnormalities. Left ventricular diastolic parameters are consistent with Grade II diastolic dysfunction (pseudonormalization). Elevated left atrial pressure.  2. Right ventricular systolic function is normal. The right ventricular size is normal.  3. The mitral valve is normal in structure. No evidence of mitral valve regurgitation. No evidence of mitral stenosis.  4. The aortic valve is normal in structure. Aortic valve regurgitation is not visualized. Mild aortic valve sclerosis is present, with no evidence of aortic valve stenosis.  5. The inferior vena cava is normal in size with greater than 50% respiratory variability, suggesting right atrial pressure of 3 mmHg. FINDINGS  Left Ventricle: Left ventricular ejection fraction,  by estimation, is 60 to 65%. The left ventricle has normal function. The left ventricle has no regional wall motion abnormalities. The left ventricular internal cavity size was normal in size. There is  no left ventricular hypertrophy. Abnormal (paradoxical) septal motion, consistent with RV pacemaker. Left ventricular diastolic parameters are consistent with Grade II diastolic dysfunction (pseudonormalization). Elevated left atrial pressure. Right Ventricle: The right ventricular size is normal. No increase  in right ventricular wall thickness. Right ventricular systolic function is normal. Left Atrium: Left atrial size was normal in size. Right Atrium: Right atrial size was normal in size. Pericardium: There is no evidence of pericardial effusion. Mitral Valve: The mitral valve is normal in structure. Mild to moderate mitral annular calcification. No evidence of mitral valve regurgitation. No evidence of mitral valve stenosis. Tricuspid Valve: The tricuspid valve is normal in structure. Tricuspid valve regurgitation is not demonstrated. No evidence of tricuspid stenosis. Aortic Valve: The aortic valve is normal in structure. Aortic valve regurgitation is not visualized. Mild aortic valve sclerosis is present, with no evidence of aortic valve stenosis. Pulmonic Valve: The pulmonic valve was normal in structure. Pulmonic valve regurgitation is not visualized. No evidence of pulmonic stenosis. Aorta: The aortic root is normal in size and structure. Venous: The inferior vena cava is normal in size with greater than 50% respiratory variability, suggesting right atrial pressure of 3 mmHg. IAS/Shunts: No atrial level shunt detected by color flow Doppler. Additional Comments: A device lead is visualized in the right ventricle.  LEFT VENTRICLE PLAX 2D LVIDd:         3.80 cm  Diastology LVIDs:         2.70 cm  LV e' medial:    5.33 cm/s LV PW:         1.00 cm  LV E/e' medial:  17.9 LV IVS:        1.40 cm  LV e' lateral:    8.38 cm/s LVOT diam:     1.80 cm  LV E/e' lateral: 11.4 LV SV:         56 LV SV Index:   29 LVOT Area:     2.54 cm  RIGHT VENTRICLE RV S prime:     9.36 cm/s TAPSE (M-mode): 1.7 cm LEFT ATRIUM             Index       RIGHT ATRIUM           Index LA diam:        2.40 cm 1.24 cm/m  RA Area:     19.10 cm LA Vol (A2C):   44.8 ml 23.19 ml/m RA Volume:   51.10 ml  26.45 ml/m LA Vol (A4C):   28.5 ml 14.75 ml/m LA Biplane Vol: 38.4 ml 19.87 ml/m  AORTIC VALVE LVOT Vmax:   99.80 cm/s LVOT Vmean:  70.800 cm/s LVOT VTI:    0.219 m  AORTA Ao Root diam: 4.00 cm MITRAL VALVE MV Area (PHT): 2.90 cm     SHUNTS MV Decel Time: 262 msec     Systemic VTI:  0.22 m MV E velocity: 95.50 cm/s   Systemic Diam: 1.80 cm MV A velocity: 101.00 cm/s MV E/A ratio:  0.95 Mihai Croitoru MD Electronically signed by Thurmon Fair MD Signature Date/Time: 07/07/2020/9:10:05 AM    Final       Subjective: No headache, chest pain, dyspnea, leg swelling, orthopnea.  Discharge Exam: Vitals:   07/18/20 1232 07/18/20 1239  BP: 139/60 139/60  Pulse: 73 73  Resp: (!) 38 14  Temp: 97.9 F (36.6 C) 97.9 F (36.6 C)  SpO2: 97% 97%   Vitals:   07/18/20 1004 07/18/20 1027 07/18/20 1232 07/18/20 1239  BP: (!) 133/47 (!) 160/56 139/60 139/60  Pulse: 84  73 73  Resp: 17  (!) 38 14  Temp: 98.4 F (36.9 C)  97.9 F (36.6 C) 97.9 F (36.6 C)  TempSrc:  Oral  Oral Oral  SpO2: 96%  97% 97%  Weight:      Height:        General: Pt is alert, awake, not in acute distress, sitting up in recliner diffuse loss of muscle mass Cardiovascular: RRR, nl S1-S2, no murmurs appreciated.   No LE edema.   Respiratory: Normal respiratory rate and rhythm.  CTAB without rales or wheezes. Abdominal: Abdomen soft and non-tender.  No distension or HSM.   Neuro/Psych: Strength symmetric in upper and lower extremities but severely generally weak.  Judgment and insight appear normal.   The results of significant diagnostics from this hospitalization  (including imaging, microbiology, ancillary and laboratory) are listed below for reference.     Microbiology: Recent Results (from the past 240 hour(s))  Culture, blood (Routine X 2) w Reflex to ID Panel     Status: None   Collection Time: 07/10/20  8:02 AM   Specimen: BLOOD  Result Value Ref Range Status   Specimen Description BLOOD BLOOD RIGHT HAND  Final   Special Requests   Final    BOTTLES DRAWN AEROBIC AND ANAEROBIC Blood Culture adequate volume   Culture   Final    NO GROWTH 5 DAYS Performed at [facility], [address], [place], [place] [address]    Report Status 07/15/2020 FINAL  Final  Anaerobic culture w Gram Stain     Status: None   Collection Time: 07/11/20 11:16 AM   Specimen: Synovium; Synovial Fluid  Result Value Ref Range Status   Specimen Description SYNOVIAL FLUID  Final   Special Requests RIGHT KNEE  Final   Gram Stain NO WBC SEEN NO ORGANISMS SEEN   Final   Culture   Final    NO ANAEROBES ISOLATED Performed at [facility], [address], [place], [place] [address]    Report Status 07/16/2020 FINAL  Final  Body fluid culture w Gram Stain     Status: None   Collection Time: 07/11/20 11:17 AM   Specimen: Synovium; Synovial Fluid  Result Value Ref Range Status   Specimen Description SYNOVIAL FLUID  Final   Special Requests RIGHT KNEE  Final   Gram Stain   Final    RARE WBC PRESENT, PREDOMINANTLY PMN NO ORGANISMS SEEN    Culture   Final    NO GROWTH 3 DAYS Performed at [facility], [address], [place], [place] [address]    Report Status 07/14/2020 FINAL  Final  SARS CORONAVIRUS 2 (TAT 6-24 HRS) Nasopharyngeal Nasopharyngeal Swab     Status: None   Collection Time: 07/16/20  3:20 PM   Specimen: Nasopharyngeal Swab  Result Value Ref Range Status   SARS Coronavirus 2 NEGATIVE NEGATIVE Final    Comment: (NOTE) SARS-CoV-2 target nucleic acids are NOT DETECTED.  The SARS-CoV-2 RNA is generally detectable in upper and  lower respiratory specimens during the acute phase of infection. Negative results do not preclude SARS-CoV-2 infection, do not rule out co-infections with other pathogens, and should not be used as the sole basis for treatment or other patient management decisions. Negative results must be combined with clinical observations, patient history, and epidemiological information. The expected result is Negative.  Fact Sheet for Patients: HairSlick.no  Fact Sheet for Healthcare Providers: quierodirigir.com  This test is not yet approved or cleared by the [place] FDA and  has been authorized for detection and/or diagnosis of SARS-CoV-2 by FDA under an Emergency Use Authorization (EUA). This EUA  will remain  in effect (meaning this test can be used) for the duration of the COVID-19 declaration under Se ction 564(b)(1) of the Act, 21 U.S.C. section 360bbb-3(b)(1), unless the authorization is terminated or revoked sooner.  Performed at [facility], [address] [place], [place] [address]   SARS CORONAVIRUS 2 (TAT 6-24 HRS) Nasopharyngeal Nasopharyngeal Swab     Status: None   Collection Time: 07/18/20  7:41 AM   Specimen: Nasopharyngeal Swab  Result Value Ref Range Status   SARS Coronavirus 2 NEGATIVE NEGATIVE Final    Comment: (NOTE) SARS-CoV-2 target nucleic acids are NOT DETECTED.  The SARS-CoV-2 RNA is generally detectable in upper and lower respiratory specimens during the acute phase of infection. Negative results do not preclude SARS-CoV-2 infection, do not rule out co-infections with other pathogens, and should not be used as the sole basis for treatment or other patient management decisions. Negative results must be combined with clinical observations, patient history, and epidemiological information. The expected result is Negative.  Fact Sheet for  Patients: HairSlick.no  Fact Sheet for Healthcare Providers: quierodirigir.com  This test is not yet approved or cleared by the [place] FDA and  has been authorized for detection and/or diagnosis of SARS-CoV-2 by FDA under an Emergency Use Authorization (EUA). This EUA will remain  in effect (meaning this test can be used) for the duration of the COVID-19 declaration under Se ction 564(b)(1) of the Act, 21 U.S.C. section 360bbb-3(b)(1), unless the authorization is terminated or revoked sooner.  Performed at [facility], [address], [place], [place] [address]      Labs: BNP (last 3 results) No results for input(s): BNP in the last 8760 hours. Basic Metabolic Panel: Recent Labs  Lab 07/12/20 0134 07/13/20 0337 07/14/20 0938 07/15/20 0042 07/18/20 0202  NA 134* 132* 130* 130* 132*  K 4.2 3.8 4.3 4.6 4.3  CL 102 100 100 101 101  CO2 20* 20* 20* 21* 21*  GLUCOSE 114* 106* 138* 121* 97  BUN 35* 37* 38* 45* 47*  CREATININE 2.13* 1.96* 2.00* 2.18* 2.03*  CALCIUM 8.1* 7.8* 8.2* 7.9* 8.8*  PHOS  --   --  3.2 3.3  --    Liver Function Tests: Recent Labs  Lab 07/12/20 0134 07/13/20 0337 07/14/20 0938 07/15/20 0042 07/18/20 0202  AST 23 45*  --   --  22  ALT 28 47*  --   --  28  ALKPHOS 72 94  --   --  75  BILITOT 0.7 0.6  --   --  0.5  PROT 5.9* 5.9*  --   --  6.1*  ALBUMIN 2.3* 2.2* 2.2* 2.1* 2.5*   No results for input(s): LIPASE, AMYLASE in the last 168 hours. No results for input(s): AMMONIA in the last 168 hours. CBC: Recent Labs  Lab 07/12/20 0134 07/13/20 0337 07/15/20 0042 07/18/20 0202  WBC 11.1* 10.6* 7.9 7.2  NEUTROABS 8.4* 7.7 6.6 4.8  HGB 9.8* 9.5* 9.2* 10.5*  HCT 29.7* 28.5* 27.5* 31.4*  MCV 94.6 93.8 93.5 93.7  PLT 316 332 405* 515*   Cardiac Enzymes: No results for input(s): CKTOTAL, CKMB, CKMBINDEX, TROPONINI in the last 168 hours. BNP: Invalid input(s):  POCBNP CBG: Recent Labs  Lab 07/16/20 1151 07/16/20 1551 07/16/20 2045 07/17/20 0615  GLUCAP 95 103* 149* 98   D-Dimer No results for input(s): DDIMER in the last 72 hours. Hgb A1c No results for input(s): HGBA1C in the last 72 hours.  Lipid Profile No results for input(s): CHOL, HDL, LDLCALC, TRIG, CHOLHDL, LDLDIRECT in the last 72 hours. Thyroid function studies No results for input(s): TSH, T4TOTAL, T3FREE, THYROIDAB in the last 72 hours.  Invalid input(s): FREET3 Anemia work up No results for input(s): VITAMINB12, FOLATE, FERRITIN, TIBC, IRON, RETICCTPCT in the last 72 hours. Urinalysis    Component Value Date/Time   COLORURINE YELLOW 07/10/2020 0957   APPEARANCEUR HAZY (A) 07/10/2020 0957   LABSPEC 1.012 07/10/2020 0957   PHURINE 6.0 07/10/2020 0957   GLUCOSEU NEGATIVE 07/10/2020 0957   HGBUR NEGATIVE 07/10/2020 0957   BILIRUBINUR NEGATIVE 07/10/2020 0957   KETONESUR NEGATIVE 07/10/2020 0957   PROTEINUR 100 (A) 07/10/2020 0957   NITRITE POSITIVE (A) 07/10/2020 0957   LEUKOCYTESUR LARGE (A) 07/10/2020 0957   Sepsis Labs Invalid input(s): PROCALCITONIN,  WBC,  LACTICIDVEN Microbiology Recent Results (from the past 240 hour(s))  Culture, blood (Routine X 2) w Reflex to ID Panel     Status: None   Collection Time: 07/10/20  8:02 AM   Specimen: BLOOD  Result Value Ref Range Status   Specimen Description BLOOD BLOOD RIGHT HAND  Final   Special Requests   Final    BOTTLES DRAWN AEROBIC AND ANAEROBIC Blood Culture adequate volume   Culture   Final    NO GROWTH 5 DAYS Performed at [facility], [address] [place], [place] [address]    Report Status 07/15/2020 FINAL  Final  Anaerobic culture w Gram Stain     Status: None   Collection Time: 07/11/20 11:16 AM   Specimen: Synovium; Synovial Fluid  Result Value Ref Range Status   Specimen Description SYNOVIAL FLUID  Final   Special Requests RIGHT KNEE  Final   Gram Stain NO WBC SEEN NO ORGANISMS SEEN    Final   Culture   Final    NO ANAEROBES ISOLATED Performed at [facility], [address], [place], [place] [address]    Report Status 07/16/2020 FINAL  Final  Body fluid culture w Gram Stain     Status: None   Collection Time: 07/11/20 11:17 AM   Specimen: Synovium; Synovial Fluid  Result Value Ref Range Status   Specimen Description SYNOVIAL FLUID  Final   Special Requests RIGHT KNEE  Final   Gram Stain   Final    RARE WBC PRESENT, PREDOMINANTLY PMN NO ORGANISMS SEEN    Culture   Final    NO GROWTH 3 DAYS Performed at [facility], [address], [place], [place] [address]    Report Status 07/14/2020 FINAL  Final  SARS CORONAVIRUS 2 (TAT 6-24 HRS) Nasopharyngeal Nasopharyngeal Swab     Status: None   Collection Time: 07/16/20  3:20 PM   Specimen: Nasopharyngeal Swab  Result Value Ref Range Status   SARS Coronavirus 2 NEGATIVE NEGATIVE Final    Comment: (NOTE) SARS-CoV-2 target nucleic acids are NOT DETECTED.  The SARS-CoV-2 RNA is generally detectable in upper and lower respiratory specimens during the acute phase of infection. Negative results do not preclude SARS-CoV-2 infection, do not rule out co-infections with other pathogens, and should not be used as the sole basis for treatment or other patient management decisions. Negative results must be combined with clinical observations, patient history, and epidemiological information. The expected result is Negative.  Fact Sheet for Patients: HairSlick.no  Fact Sheet for Healthcare Providers: quierodirigir.com  This test is not yet approved or cleared by the [place] FDA and  has  been authorized for detection and/or diagnosis of SARS-CoV-2 by FDA under an Emergency Use Authorization (EUA). This EUA will remain  in effect (meaning this test can be used) for the duration of the COVID-19 declaration under Se ction 564(b)(1) of the Act, 21  U.S.C. section 360bbb-3(b)(1), unless the authorization is terminated or revoked sooner.  Performed at [facility], [address] [place], [place] [address]   SARS CORONAVIRUS 2 (TAT 6-24 HRS) Nasopharyngeal Nasopharyngeal Swab     Status: None   Collection Time: 07/18/20  7:41 AM   Specimen: Nasopharyngeal Swab  Result Value Ref Range Status   SARS Coronavirus 2 NEGATIVE NEGATIVE Final    Comment: (NOTE) SARS-CoV-2 target nucleic acids are NOT DETECTED.  The SARS-CoV-2 RNA is generally detectable in upper and lower respiratory specimens during the acute phase of infection. Negative results do not preclude SARS-CoV-2 infection, do not rule out co-infections with other pathogens, and should not be used as the sole basis for treatment or other patient management decisions. Negative results must be combined with clinical observations, patient history, and epidemiological information. The expected result is Negative.  Fact Sheet for Patients: HairSlick.no  Fact Sheet for Healthcare Providers: quierodirigir.com  This test is not yet approved or cleared by the [place] FDA and  has been authorized for detection and/or diagnosis of SARS-CoV-2 by FDA under an Emergency Use Authorization (EUA). This EUA will remain  in effect (meaning this test can be used) for the duration of the COVID-19 declaration under Se ction 564(b)(1) of the Act, 21 U.S.C. section 360bbb-3(b)(1), unless the authorization is terminated or revoked sooner.  Performed at [facility], [address] [place], [place] [address]      Time coordinating discharge: 25 minutes The [place] controlled substances registry was reviewed for this patient     30 Day Unplanned Readmission Risk Score    Flowsheet Row Admission (Current) from 07/06/2020 in [facility] 4E CV SURGICAL PROGRESSIVE CARE  30 Day Unplanned Readmission Risk Score (%) 21.17 Filed at  07/18/2020 1200       This score is the patient's risk of an unplanned readmission within 30 days of being discharged (0 -100%). The score is based on dignosis, age, lab data, medications, orders, and past utilization.   Low:  0-14.9   Medium: 15-21.9   High: 22-29.9   Extreme: 30 and above             SIGNED:   Alberteen Sam, MD  [facility] 07/18/2020, 3:02 PM

## 2020-07-19 ENCOUNTER — Inpatient Hospital Stay (HOSPITAL_COMMUNITY)
Admission: AD | Disposition: A | Discharge status: Skilled Nursing Facility | Payer: Self-pay | Source: Other Acute Inpatient Hospital | Attending: Family Medicine

## 2020-07-19 ENCOUNTER — Encounter (HOSPITAL_COMMUNITY): Payer: Self-pay | Admitting: Cardiology

## 2020-07-19 DIAGNOSIS — R55 Syncope and collapse: Secondary | ICD-10-CM

## 2020-07-19 HISTORY — PX: PACEMAKER IMPLANT: EP1218

## 2020-07-19 LAB — SURGICAL PCR SCREEN
MRSA, PCR: NEGATIVE
Staphylococcus aureus: NEGATIVE

## 2020-07-19 SURGERY — PACEMAKER IMPLANT

## 2020-07-19 MED ORDER — VANCOMYCIN HCL IN DEXTROSE 1-5 GM/200ML-% IV SOLN
INTRAVENOUS | Status: AC
  Filled 2020-07-19: qty 200

## 2020-07-19 MED ORDER — SODIUM CHLORIDE 0.9 % IV SOLN
INTRAVENOUS | Status: DC | PRN
  Administered 2020-07-19: 500 mL

## 2020-07-19 MED ORDER — FENTANYL CITRATE (PF) 100 MCG/2ML IJ SOLN
INTRAMUSCULAR | Status: DC | PRN
  Administered 2020-07-19 (×2): 12.5 ug via INTRAVENOUS

## 2020-07-19 MED ORDER — LIDOCAINE HCL (PF) 1 % IJ SOLN
INTRAMUSCULAR | Status: DC | PRN
  Administered 2020-07-19: 60 mL

## 2020-07-19 MED ORDER — ACETAMINOPHEN 500 MG PO TABS
500.0000 mg | ORAL_TABLET | Freq: Three times a day (TID) | ORAL | Status: DC
  Administered 2020-07-19 – 2020-07-20 (×3): 500 mg via ORAL
  Filled 2020-07-19 (×3): qty 1

## 2020-07-19 MED ORDER — MORPHINE SULFATE (PF) 2 MG/ML IV SOLN
2.0000 mg | Freq: Once | INTRAVENOUS | Status: AC
  Administered 2020-07-19: 2 mg via INTRAVENOUS
  Filled 2020-07-19: qty 1

## 2020-07-19 MED ORDER — HEPARIN (PORCINE) IN NACL 1000-0.9 UT/500ML-% IV SOLN
INTRAVENOUS | Status: AC
  Filled 2020-07-19: qty 500

## 2020-07-19 MED ORDER — CHLORHEXIDINE GLUCONATE 4 % EX LIQD
60.0000 mL | Freq: Once | CUTANEOUS | Status: AC
  Administered 2020-07-19: 4 via TOPICAL
  Filled 2020-07-19: qty 15

## 2020-07-19 MED ORDER — SODIUM CHLORIDE 0.9% FLUSH: 3.0000 mL | INTRAVENOUS | Status: DC | PRN

## 2020-07-19 MED ORDER — SODIUM CHLORIDE 0.9 % IV SOLN: INTRAVENOUS | Status: DC

## 2020-07-19 MED ORDER — FENTANYL CITRATE (PF) 100 MCG/2ML IJ SOLN
INTRAMUSCULAR | Status: AC
  Filled 2020-07-19: qty 2

## 2020-07-19 MED ORDER — LIDOCAINE HCL (PF) 1 % IJ SOLN
INTRAMUSCULAR | Status: AC
  Filled 2020-07-19: qty 60

## 2020-07-19 MED ORDER — SODIUM CHLORIDE 0.9 % IV SOLN
INTRAVENOUS | Status: AC
  Filled 2020-07-19: qty 2

## 2020-07-19 MED ORDER — HEPARIN (PORCINE) IN NACL 1000-0.9 UT/500ML-% IV SOLN
INTRAVENOUS | Status: DC | PRN
  Administered 2020-07-19: 500 mL

## 2020-07-19 MED ORDER — ACETAMINOPHEN 325 MG PO TABS: 325.0000 mg | ORAL_TABLET | ORAL | Status: DC | PRN

## 2020-07-19 MED ORDER — MIDAZOLAM HCL 5 MG/5ML IJ SOLN
INTRAMUSCULAR | Status: AC
  Filled 2020-07-19: qty 5

## 2020-07-19 MED ORDER — ONDANSETRON HCL 4 MG/2ML IJ SOLN
4.0000 mg | Freq: Four times a day (QID) | INTRAMUSCULAR | Status: DC | PRN
  Administered 2020-07-20: 4 mg via INTRAVENOUS
  Filled 2020-07-19: qty 2

## 2020-07-19 MED ORDER — VANCOMYCIN HCL 1000 MG/200ML IV SOLN
1000.0000 mg | INTRAVENOUS | Status: AC
  Administered 2020-07-19: 1000 mg via INTRAVENOUS

## 2020-07-19 MED ORDER — SODIUM CHLORIDE 0.9 % IV SOLN
80.0000 mg | INTRAVENOUS | Status: AC
  Administered 2020-07-19: 80 mg

## 2020-07-19 SURGICAL SUPPLY — 8 items
CABLE SURGICAL S-101-97-12 (CABLE) ×2 IMPLANT
KIT MICROPUNCTURE NIT STIFF (SHEATH) ×1 IMPLANT
LEAD SOLIA S PRO MRI 53 (Lead) ×1 IMPLANT
LEAD SOLIA S PRO MRI 60 (Lead) ×1 IMPLANT
PACEMAKER EDORA 8DR-T MRI (Pacemaker) ×1 IMPLANT
PAD PRO RADIOLUCENT 2001M-C (PAD) ×2 IMPLANT
SHEATH 7FR PRELUDE SNAP 13 (SHEATH) ×2 IMPLANT
TRAY PACEMAKER INSERTION (PACKS) ×2 IMPLANT

## 2020-07-19 NOTE — Progress Notes (Signed)
[facility] PROGRESS NOTE    Sostenes Kauffmann Branscum  VZC:[id number] DOB: [birth year] DOA: 07/06/2020 PCP: No primary care provider on file.      Brief Narrative:  Mr. Frank Avila is a 87-year-old M with spinal muscular dystrophy, wheelchair-bound, lives independently, HTN, CAD status post CABG 1993, PCI 2012 and hyperlipidemia presented with a fall.  He developed lightheadedness, passed out and woke up on the ground.  In the ER at [facility], he was bradycardic, hypotensive with pulses in the 20s and 30s.  Given atropine and a transvenous pacing wire was placed and found to have left ankle fracture and transferred to [facility].    Assessment & Plan:  Bradycardia See discharge summary dated 6/15 - For pacer today  Acute gout flare - Continue colchicine  Left tib-fib fracture Improving well - Follow-up with Dr. Marcello Fennel  Essential tremor - Continue primidone  Dementia - Continue donepezil  Hypertension Coronary artery disease, secondary prevention - Continue amlodipine, atorvastatin, furosemide  BPH - Continue Flomax  Polyneuropathy - Continue gabapentin  Asthma - Continue montelukast, PPI  Hyponatremia Mild, asymptomatic  Anemia of chronic disease Hemoglobin stable, no clinical bleeding        Disposition: Status is: Inpatient  Remains inpatient appropriate because:Unsafe d/c plan  Dispo: The patient is from: Home              Anticipated d/c is to: SNF              Patient currently is not medically stable to d/c.   Difficult to place patient No       Level of care: Progressive       MDM: The below labs and imaging reports were reviewed and summarized above.  Medication management as above.    DVT prophylaxis: SCDs Start: 07/06/20 2234  Code Status: Full code           Subjective: No chest pain, dyspnea, shortness of breath.  Objective: Vitals:   07/19/20 1719 07/19/20 1744 07/19/20 1800 07/19/20 1815  BP:  (!) 162/71 (!) 134/56 138/61 134/66  Pulse: 65 67  76  Resp: 10 14 (!) 21 (!) 22  Temp:  97.9 F (36.6 C)    TempSrc:  Oral    SpO2: 100% 96%  100%  Weight:      Height:        Intake/Output Summary (Last 24 hours) at 07/19/2020 1915 Last data filed at 07/19/2020 1833 Gross per 24 hour  Intake 240 ml  Output 1800 ml  Net -1560 ml   Filed Weights   07/08/20 0350 07/09/20 0600 07/11/20 0329  Weight: 77.5 kg 74.6 kg 74.7 kg    Examination: General appearance:  adult male, alert and in no acute distress.   HEENT:    Skin:   Cardiac: Slow, irregular, nl S1-S2, no murmurs appreciated.  Capillary refill is brisk.  JVP normal.  No LE edema.  Radial pulses 2+ and symmetric. Respiratory: Normal respiratory rate and rhythm.  CTAB without rales or wheezes. Abdomen: Abdomen soft.  no TTP. No ascites, distension, hepatosplenomegaly.   MSK: No  effusions.  Diffuse loss of muscle mass.  Left ankle in cast. Neuro: Awake and alert.  EOMI, moves all extremities with severe generalized weakness. Speech fluent.    Psych: Sensorium intact and responding to questions, attention normal. Affect blunted.  Judgment and insight appear impaired.    Data Reviewed: I have personally reviewed following labs and imaging studies:  CBC: Recent Labs  Lab 07/13/20 0337 07/15/20 0042 07/18/20 0202  WBC 10.6* 7.9 7.2  NEUTROABS 7.7 6.6 4.8  HGB 9.5* 9.2* 10.5*  HCT 28.5* 27.5* 31.4*  MCV 93.8 93.5 93.7  PLT 332 405* 515*   Basic Metabolic Panel: Recent Labs  Lab 07/13/20 0337 07/14/20 0938 07/15/20 0042 07/18/20 0202  NA 132* 130* 130* 132*  K 3.8 4.3 4.6 4.3  CL 100 100 101 101  CO2 20* 20* 21* 21*  GLUCOSE 106* 138* 121* 97  BUN 37* 38* 45* 47*  CREATININE 1.96* 2.00* 2.18* 2.03*  CALCIUM 7.8* 8.2* 7.9* 8.8*  PHOS  --  3.2 3.3  --    GFR: Estimated Creatinine Clearance: 23.4 mL/min (A) (by C-G formula based on SCr of 2.03 mg/dL (H)). Liver Function Tests: Recent Labs  Lab  07/13/20 0337 07/14/20 0938 07/15/20 0042 07/18/20 0202  AST 45*  --   --  22  ALT 47*  --   --  28  ALKPHOS 94  --   --  75  BILITOT 0.6  --   --  0.5  PROT 5.9*  --   --  6.1*  ALBUMIN 2.2* 2.2* 2.1* 2.5*   No results for input(s): LIPASE, AMYLASE in the last 168 hours. No results for input(s): AMMONIA in the last 168 hours. Coagulation Profile: No results for input(s): INR, PROTIME in the last 168 hours. Cardiac Enzymes: No results for input(s): CKTOTAL, CKMB, CKMBINDEX, TROPONINI in the last 168 hours. BNP (last 3 results) No results for input(s): PROBNP in the last 8760 hours. HbA1C: No results for input(s): HGBA1C in the last 72 hours. CBG: Recent Labs  Lab 07/16/20 1151 07/16/20 1551 07/16/20 2045 07/17/20 0615 07/18/20 1512  GLUCAP 95 103* 149* 98 115*   Lipid Profile: No results for input(s): CHOL, HDL, LDLCALC, TRIG, CHOLHDL, LDLDIRECT in the last 72 hours. Thyroid Function Tests: No results for input(s): TSH, T4TOTAL, FREET4, T3FREE, THYROIDAB in the last 72 hours. Anemia Panel: No results for input(s): VITAMINB12, FOLATE, FERRITIN, TIBC, IRON, RETICCTPCT in the last 72 hours. Urine analysis:    Component Value Date/Time   COLORURINE YELLOW 07/10/2020 0957   APPEARANCEUR HAZY (A) 07/10/2020 0957   LABSPEC 1.012 07/10/2020 0957   PHURINE 6.0 07/10/2020 0957   GLUCOSEU NEGATIVE 07/10/2020 0957   HGBUR NEGATIVE 07/10/2020 0957   BILIRUBINUR NEGATIVE 07/10/2020 0957   KETONESUR NEGATIVE 07/10/2020 0957   PROTEINUR 100 (A) 07/10/2020 0957   NITRITE POSITIVE (A) 07/10/2020 0957   LEUKOCYTESUR LARGE (A) 07/10/2020 0957   Sepsis Labs: @LABRCNTIP (procalcitonin:4,lacticacidven:4)  ) Recent Results (from the past 240 hour(s))  Culture, blood (Routine X 2) w Reflex to ID Panel     Status: None   Collection Time: 07/10/20  8:02 AM   Specimen: BLOOD  Result Value Ref Range Status   Specimen Description BLOOD BLOOD RIGHT HAND  Final   Special Requests    Final    BOTTLES DRAWN AEROBIC AND ANAEROBIC Blood Culture adequate volume   Culture   Final    NO GROWTH 5 DAYS Performed at [facility], [address] [place], [place] [address]    Report Status 07/15/2020 FINAL  Final  Anaerobic culture w Gram Stain     Status: None   Collection Time: 07/11/20 11:16 AM   Specimen: Synovium; Synovial Fluid  Result Value Ref Range Status   Specimen Description SYNOVIAL FLUID  Final   Special Requests RIGHT KNEE  Final   Gram Stain NO WBC SEEN  NO ORGANISMS SEEN   Final   Culture   Final    NO ANAEROBES ISOLATED Performed at [facility], [address], [place], [place] [address]    Report Status 07/16/2020 FINAL  Final  Body fluid culture w Gram Stain     Status: None   Collection Time: 07/11/20 11:17 AM   Specimen: Synovium; Synovial Fluid  Result Value Ref Range Status   Specimen Description SYNOVIAL FLUID  Final   Special Requests RIGHT KNEE  Final   Gram Stain   Final    RARE WBC PRESENT, PREDOMINANTLY PMN NO ORGANISMS SEEN    Culture   Final    NO GROWTH 3 DAYS Performed at [facility], [address] [place], [place] [address]    Report Status 07/14/2020 FINAL  Final  SARS CORONAVIRUS 2 (TAT 6-24 HRS) Nasopharyngeal Nasopharyngeal Swab     Status: None   Collection Time: 07/16/20  3:20 PM   Specimen: Nasopharyngeal Swab  Result Value Ref Range Status   SARS Coronavirus 2 NEGATIVE NEGATIVE Final    Comment: (NOTE) SARS-CoV-2 target nucleic acids are NOT DETECTED.  The SARS-CoV-2 RNA is generally detectable in upper and lower respiratory specimens during the acute phase of infection. Negative results do not preclude SARS-CoV-2 infection, do not rule out co-infections with other pathogens, and should not be used as the sole basis for treatment or other patient management decisions. Negative results must be combined with clinical observations, patient history, and epidemiological information. The  expected result is Negative.  Fact Sheet for Patients: HairSlick.no  Fact Sheet for Healthcare Providers: quierodirigir.com  This test is not yet approved or cleared by the [place] FDA and  has been authorized for detection and/or diagnosis of SARS-CoV-2 by FDA under an Emergency Use Authorization (EUA). This EUA will remain  in effect (meaning this test can be used) for the duration of the COVID-19 declaration under Se ction 564(b)(1) of the Act, 21 U.S.C. section 360bbb-3(b)(1), unless the authorization is terminated or revoked sooner.  Performed at [facility], [address], [place], [place] [address]   SARS CORONAVIRUS 2 (TAT 6-24 HRS) Nasopharyngeal Nasopharyngeal Swab     Status: None   Collection Time: 07/18/20  7:41 AM   Specimen: Nasopharyngeal Swab  Result Value Ref Range Status   SARS Coronavirus 2 NEGATIVE NEGATIVE Final    Comment: (NOTE) SARS-CoV-2 target nucleic acids are NOT DETECTED.  The SARS-CoV-2 RNA is generally detectable in upper and lower respiratory specimens during the acute phase of infection. Negative results do not preclude SARS-CoV-2 infection, do not rule out co-infections with other pathogens, and should not be used as the sole basis for treatment or other patient management decisions. Negative results must be combined with clinical observations, patient history, and epidemiological information. The expected result is Negative.  Fact Sheet for Patients: HairSlick.no  Fact Sheet for Healthcare Providers: quierodirigir.com  This test is not yet approved or cleared by the [place] FDA and  has been authorized for detection and/or diagnosis of SARS-CoV-2 by FDA under an Emergency Use Authorization (EUA). This EUA will remain  in effect (meaning this test can be used) for the duration of the COVID-19 declaration under  Se ction 564(b)(1) of the Act, 21 U.S.C. section 360bbb-3(b)(1), unless the authorization is terminated or revoked sooner.  Performed at [facility], [address], [place], [place] [address]   Surgical PCR screen     Status: None  Collection Time: 07/18/20 11:41 PM   Specimen: Nasal Mucosa; Nasal Swab  Result Value Ref Range Status   MRSA, PCR NEGATIVE NEGATIVE Final   Staphylococcus aureus NEGATIVE NEGATIVE Final    Comment: (NOTE) The Xpert SA Assay (FDA approved for NASAL specimens in patients 22 years of age and older), is one component of a comprehensive surveillance program. It is not intended to diagnose infection nor to guide or monitor treatment. Performed at [facility], [address] [place], [place] [address]          Radiology Studies: DG Tibia/Fibula Left Port  Result Date: 07/18/2020 CLINICAL DATA:  Left lower leg fracture EXAM: PORTABLE LEFT TIBIA AND FIBULA - 2 VIEW COMPARISON:  07/09/2020 FINDINGS: Fracture distal tibia with mild angulation and mild displacement unchanged from the prior study. No fracture of the fibula. Casting of the lower leg. IMPRESSION: Mildly displaced and angulated fracture distal tibia unchanged from the prior examination. Electronically Signed   By: Marlan Palau M.D.   On: 07/18/2020 11:42        Scheduled Meds:  acetaminophen  500 mg Oral TID   amLODipine  10 mg Oral Daily   atorvastatin  20 mg Oral Daily   Chlorhexidine Gluconate Cloth  6 each Topical Daily   colchicine  0.6 mg Oral Daily   donepezil  10 mg Oral BID   furosemide  40 mg Oral Daily   gabapentin  800 mg Oral QPC supper   montelukast  10 mg Oral Daily   pantoprazole  40 mg Oral Q0600   polyethylene glycol  17 g Oral Daily   primidone  25 mg Oral QHS   psyllium  1 packet Oral Daily   sodium chloride flush  3 mL Intravenous Q12H   sodium chloride flush  3 mL Intravenous Q12H   tamsulosin  0.4 mg Oral QHS   vitamin B-12  1,000 mcg Oral  Daily   Continuous Infusions:  sodium chloride       LOS: 13 days    Time spent: 25 minutes    Alberteen Sam, MD [facility] 07/19/2020, 7:15 PM     [place]leas

## 2020-07-19 NOTE — Progress Notes (Signed)
Physical Therapy Treatment Patient Details Name: Frank Avila MRN: [id number] DOB: 1928/04/26 Today's Date: 07/19/2020    History of Present Illness 87 yo admitted 6/3 after syncope while in [facility] which pt slid out of WC getting his LLE caught. He was bradycardic with 2nd degree AV block resolved with medication. pt found to have LLE tibia fx treated non operatively. PMhx: HTN, HLD, CAD s/p CABG, spinal muscular dystrophy (diagnosed when he was 87 y/o).    PT Comments    Pt received in recliner chair, agreeable to therapy session and with good participation and fair tolerance for transfer training. Pt requiring increased time/cues to initiate and perform all mobility tasks due to fatigue after recently working with OT. Pt needing up to +2 modA for transfers and bed mobility. Plan for pacemaker implant this afternoon, may need PT re-eval post-op. Pt continues to benefit from PT services to progress toward functional mobility goals.    Follow Up Recommendations  SNF     Equipment Recommendations  None recommended by PT (defer to next location)    Recommendations for Other Services       Precautions / Restrictions Precautions Precautions: Fall Precaution Comments: per pt no muscular control in LLE and foot drop in RLE, sometimes poor trunk control Restrictions Weight Bearing Restrictions: Yes RLE Weight Bearing: Weight bearing as tolerated LLE Weight Bearing: Non weight bearing    Mobility  Bed Mobility Overal bed mobility: Needs Assistance Bed Mobility: Sit to Sidelying;Rolling Rolling: Mod assist   Supine to sit: Mod assist Sit to supine: Mod assist;+2 for physical assistance   General bed mobility comments: mod to maxA for BLE assist (pt able to help with RLE but not with LLE at baseline), cues for reverse log roll but pt unable to follow instructions for sidelying    Transfers Overall transfer level: Needs assistance Equipment used: Sliding board Transfers:  Lateral/Scoot Transfers          Lateral/Scoot Transfers: Mod assist;+2 physical assistance;With slide board General transfer comment: mod cues for upright/anterior lean rather than posterior to prevent sliding off board, pt very fatigued after transferring to then sitting up in chair ~20 mins. Once sitting EOB, pt able to scoot toward [facility] with minA and small propulsion scoots.  Ambulation/Gait                 Stairs             Wheelchair Mobility    Modified Rankin (Stroke Patients Only)       Balance Overall balance assessment: Needs assistance Sitting-balance support: Feet supported;No upper extremity supported Sitting balance-Leahy Scale: Fair Sitting balance - Comments: reliant on UE support EOB intermittent progression to one UE support; needs close Supervision to min guard for safety especially with weight shifting Postural control: Posterior lean                                  Cognition Arousal/Alertness: Awake/alert Behavior During Therapy: WFL for tasks assessed/performed Overall Cognitive Status: Within Functional Limits for tasks assessed Area of Impairment: Attention;Following commands;Safety/judgement                   Current Attention Level: Selective   Following Commands: Follows one step commands with increased time;Follows one step commands consistently Safety/Judgement: Decreased awareness of safety;Decreased awareness of deficits     General Comments: Pt somewhat slower processing when seen after OT for return  transfer and very soft voice, seeming a bit groggy. VSS however on RA and able to follow cues with increased time, notably decreased initiation and strength compared with session with OT earlier in day.      Exercises Other Exercises Other Exercises: Chair pushups    General Comments General comments (skin integrity, edema, etc.): BP 121/54 (73) seated in chair, received on RA; BP 135/51 (77) post- scoot  transfer; RR 15-18 rpm; HR 66-86 bpm with exertion; Pt reports 5/10 modified RPE (fatigue) when staff arrived to room prior to mobility.      Pertinent Vitals/Pain Pain Assessment: Faces Faces Pain Scale: Hurts a little bit Pain Location: L LE with repositioning efforts Pain Descriptors / Indicators: Sore Pain Intervention(s): Monitored during session;Repositioned;Limited activity within patient's tolerance    Home Living                      Prior Function            PT Goals (current goals can now be found in the care plan section) Acute Rehab PT Goals Patient Stated Goal: go to [facility] for rehab then return home PT Goal Formulation: With patient Time For Goal Achievement: 07/23/20 Progress towards PT goals: Progressing toward goals    Frequency    Min 3X/week      PT Plan Current plan remains appropriate    Co-evaluation              AM-PAC PT "6 Clicks" Mobility   Outcome Measure  Help needed turning from your back to your side while in a flat bed without using bedrails?: A Lot Help needed moving from lying on your back to sitting on the side of a flat bed without using bedrails?: A Lot Help needed moving to and from a bed to a chair (including a wheelchair)?: A Lot Help needed standing up from a chair using your arms (e.g., wheelchair or bedside chair)?: Total Help needed to walk in hospital room?: Total Help needed climbing 3-5 steps with a railing? : Total 6 Click Score: 9    End of Session Equipment Utilized During Treatment: Other (comment) (transfer pads, slide board) Activity Tolerance: Patient limited by fatigue Patient left: in bed;with call bell/phone within reach;with bed alarm set;Other (comment) (pillow under R hip to offload, heels floated) Nurse Communication: Mobility status PT Visit Diagnosis: Other abnormalities of gait and mobility (R26.89);Muscle weakness (generalized) (M62.81)     Time: 4782-9562 PT Time Calculation (min)  (ACUTE ONLY): 24 min  Charges:  $Therapeutic Activity: 23-37 mins                     Idrees Quam P., PTA Acute Rehabilitation Services Pager: [phone] Office: [phone]    Angus Palms 07/19/2020, 3:40 PM

## 2020-07-19 NOTE — Plan of Care (Signed)

## 2020-07-19 NOTE — Progress Notes (Addendum)
Progress Note  Patient Name: Frank Avila Date of Encounter: 07/19/2020  [facility] Cardiologist: Dr. Judithe Modest  Subjective   Feeling much better today  Inpatient Medications    Scheduled Meds:  amLODipine  10 mg Oral Daily   atorvastatin  20 mg Oral Daily   chlorhexidine  60 mL Topical Once   Chlorhexidine Gluconate Cloth  6 each Topical Daily   colchicine  0.6 mg Oral Daily   donepezil  10 mg Oral BID   furosemide  40 mg Oral Daily   gabapentin  800 mg Oral QPC supper   gentamicin irrigation  80 mg Irrigation On Call   montelukast  10 mg Oral Daily   pantoprazole  40 mg Oral Q0600   polyethylene glycol  17 g Oral Daily   primidone  25 mg Oral QHS   psyllium  1 packet Oral Daily   sodium chloride flush  3 mL Intravenous Q12H   sodium chloride flush  3 mL Intravenous Q12H   tamsulosin  0.4 mg Oral QHS   vitamin B-12  1,000 mcg Oral Daily   Continuous Infusions:  sodium chloride     sodium chloride     vancomycin     PRN Meds: sodium chloride, acetaminophen, lidocaine, melatonin, metoCLOPramide, prochlorperazine, senna-docusate, sodium chloride flush, sodium chloride flush, traMADol   Vital Signs    Vitals:   07/18/20 2053 07/19/20 0004 07/19/20 0247 07/19/20 0800  BP: (!) 137/57 113/60 128/61 136/60  Pulse: 82 77 80 (!) 57  Resp: 20 13 13 15   Temp: (!) 97.5 F (36.4 C) 97.7 F (36.5 C) 97.7 F (36.5 C) 98 F (36.7 C)  TempSrc: Oral Oral Oral Oral  SpO2: 100% 98% 96% 96%  Weight:      Height:        Intake/Output Summary (Last 24 hours) at 07/19/2020 0809 Last data filed at 07/19/2020 0701 Gross per 24 hour  Intake 360 ml  Output 2325 ml  Net -1965 ml   Last 3 Weights 07/11/2020 07/09/2020 07/08/2020  Weight (lbs) 164 lb 10.9 oz 164 lb 7.4 oz 170 lb 13.7 oz  Weight (kg) 74.7 kg 74.6 kg 77.5 kg      Telemetry    SB/SR  50's-60's  asystolic event yesterday, none since- Personally Reviewed  ECG    No new EKGs - Personally Reviewed  Physical  Exam    GEN: No acute distress.   Neck: No JVD Cardiac: RRR, no murmurs, rubs, or gallops.  Respiratory: CTA b/l. GI: Soft, nontender, non-distended  MS: No edema; cast LLE Neuro:  Nonfocal  Psych: Normal affect   Labs    High Sensitivity Troponin:  No results for input(s): TROPONINIHS in the last 720 hours.    Chemistry Recent Labs  Lab 07/13/20 0337 07/14/20 0938 07/15/20 0042 07/18/20 0202  NA 132* 130* 130* 132*  K 3.8 4.3 4.6 4.3  CL 100 100 101 101  CO2 20* 20* 21* 21*  GLUCOSE 106* 138* 121* 97  BUN 37* 38* 45* 47*  CREATININE 1.96* 2.00* 2.18* 2.03*  CALCIUM 7.8* 8.2* 7.9* 8.8*  PROT 5.9*  --   --  6.1*  ALBUMIN 2.2* 2.2* 2.1* 2.5*  AST 45*  --   --  22  ALT 47*  --   --  28  ALKPHOS 94  --   --  75  BILITOT 0.6  --   --  0.5  GFRNONAA 32* 31* 28* 31*  ANIONGAP 12 10 8  10     Hematology Recent Labs  Lab 07/13/20 0337 07/15/20 0042 07/18/20 0202  WBC 10.6* 7.9 7.2  RBC 3.04* 2.94* 3.35*  HGB 9.5* 9.2* 10.5*  HCT 28.5* 27.5* 31.4*  MCV 93.8 93.5 93.7  MCH 31.3 31.3 31.3  MCHC 33.3 33.5 33.4  RDW 12.3 12.1 12.3  PLT 332 405* 515*    BNPNo results for input(s): BNP, PROBNP in the last 168 hours.   DDimer No results for input(s): DDIMER in the last 168 hours.   Radiology    DG Tibia/Fibula Left Port  Result Date: 07/18/2020 CLINICAL DATA:  Left lower leg fracture EXAM: PORTABLE LEFT TIBIA AND FIBULA - 2 VIEW COMPARISON:  07/09/2020 FINDINGS: Fracture distal tibia with mild angulation and mild displacement unchanged from the prior study. No fracture of the fibula. Casting of the lower leg. IMPRESSION: Mildly displaced and angulated fracture distal tibia unchanged from the prior examination. Electronically Signed   By: Marlan Palau M.D.   On: 07/18/2020 11:42    Cardiac Studies   2D echo 07/2020 IMPRESSIONS   1. Left ventricular ejection fraction, by estimation, is 60 to 65%. The  left ventricle has normal function. The left ventricle has no  regional  wall motion abnormalities. Left ventricular diastolic parameters are  consistent with Grade II diastolic  dysfunction (pseudonormalization). Elevated left atrial pressure.   2. Right ventricular systolic function is normal. The right ventricular  size is normal.   3. The mitral valve is normal in structure. No evidence of mitral valve  regurgitation. No evidence of mitral stenosis.   4. The aortic valve is normal in structure. Aortic valve regurgitation is  not visualized. Mild aortic valve sclerosis is present, with no evidence  of aortic valve stenosis.   5. The inferior vena cava is normal in size with greater than 50%  respiratory variability, suggesting right atrial pressure of 3 mmHg.  Patient Profile     87 y.o. male male w/PMHx of CAD (CABG 1993, PCI to grafts 2012), HTN, HLD, spinal muscular dystrophy (uses motorized wheelchair)   Initially admitted to [facility] via [facility] where he was for syncope, he was found bradycardic in the 20's with reports of V bigemeny and other notes report some degree of AVblock described as second degree AVblock and reports of V bigemeny, in the environment of newly started propanolol (for tremor), temp pacing wire was placed, he was also found to have L tib-fib fx   Syncopal episode described as started to feel poorly became nauseous, diaphoretic, vomited and fainted sufferiing L tib/fib fracture (casted)   TTE noted LVEF 60-65%   Cardiology was consulted and his propanolol stopped EP also consulted seen by Dr. Ladona Ridgel at the time of his consult 07/07/20 he had regained conduction noting baseline RBBB, and suspected that he would not need pacing off BB tx   Started on primidone for his tremor Temp wire removed 07/08/20   Cardiology signed off 07/10/20 with no further bradycardia Recalled 07/13/20 with 2 transient bradycardic episodes, though had been nauseous all day and at least once with emesis and associated normal rates. We briefly  discussed the case and in the environment of nausea suspected to have been vagal and monitoring for recurrent bradycardia without a potential tirigger was planned.   Cardiology followed the patient, note reports case discussed with prior cardiologist following and EP with no indication for pacing though it seems at patient request or demand that he get a PPM implant and EP  is asked to revisit  EP has followed since  Assessment & Plan    Bradycardia   Initial presentation as far as I can tell was V bigeminy and subsequent bradycardia Though 2nd degree AVblock was discussed as well AV conduction recovered off the propanolol (rx for his tremor)   07/13/20 2 episodes of transient sinus bradycardia Notes report quite nauseous this day and telemetry supports vagally mediated bradycardia   Nauseous again intermittently with some reports diarrhea as well have been noted without bradycardia, felt to further supports bradycardia as 2/2 his nausea/vagally mediated  He was seen yesterday by Dr. Graciela Husbands, planned for [facility] upon discharge  Yesterday he was seated in his chair reported feeling nauseous and poorly and quickly following this had a 11.3s  cond asystolic event followed by SB 27'O > 50s back to baseline.  Recommended pacer implant at this juncture, he is on today's scheduled with Dr. Lalla Brothers.   2. L tib-fib fracture Casted  3. R knee gout 4. During this stay, ?poss UTI Treated with bactrim 5. Mucular dystrophy     Wheelchair bound     Lives at home alone 6. Tremor 7. CKD    For questions or updates, please contact [facility] Please consult [email or web address] for contact info under        Signed, Sheilah Pigeon, PA-C  07/19/2020, 8:09 AM

## 2020-07-19 NOTE — Progress Notes (Signed)
Pt back to 4E22 from cath lab procedure - pacemaker placement.  Complaining of pain in right knee related to positioning during procedure, but otherwise without complaints.  VS assessed, WNL.  Pt friends at bedside. Call bell within reach.

## 2020-07-19 NOTE — Discharge Instructions (Addendum)
    Supplemental Discharge Instructions for  Pacemaker/Defibrillator Patients  Tomorrow, send in a device transmission  Activity No heavy lifting or vigorous activity with your left arm for 6 to 8 weeks.  Do not raise your left arm above your head for one week.  Gradually raise your affected arm as drawn below.             07/27/20                    07/28/20                    07/29/20                 07/30/20 __  NO DRIVING until cleared to at your wound check visit.  WOUND CARE Keep the wound area clean and dry.  Do not get this area wet , no showers until cleared to at your wound check visit The tape/steri-strips on your wound will fall off; do not pull them off.  No bandage is needed on the site.  DO  NOT apply any creams, oils, or ointments to the wound area. If you notice any drainage or discharge from the wound, any swelling or bruising at the site, or you develop a fever > 101? F after you are discharged home, call the office at once.  Special Instructions You are still able to use cellular telephones; use the ear opposite the side where you have your pacemaker/defibrillator.  Avoid carrying your cellular phone near your device. When traveling through airports, show security personnel your identification card to avoid being screened in the metal detectors.  Ask the security personnel to use the hand wand. Avoid arc welding equipment, MRI testing (magnetic resonance imaging), TENS units (transcutaneous nerve stimulators).  Call the office for questions about other devices. Avoid electrical appliances that are in poor condition or are not properly grounded. Microwave ovens are safe to be near or to operate.   Orthopaedic Discharge instructions  Nonweightbearing left leg Keep cast clean and dry Do not stick anything down cast Follow up with ortho in 2 weeks Call [phone] for appointment

## 2020-07-19 NOTE — TOC Progression Note (Signed)
Transition of Care [facility]) - Progression Note    Patient Details  Name: Frank Avila MRN: [id number] Date of Birth: Feb 21, 1928  [facility]) CM/SW Contact  [name] Aris [place], [place] Phone Number: 07/19/2020, 1:31 PM  Clinical Narrative:     CSW received call from [facility] at [facility]; they can accept pt but he would likely not able to use his electric wheelchair. [facility] can accept if pt is agreeable to manual wheelchair (they can evaluate upon admission if he can use electric chair). CSW notified pt and he is agreeable. He states that he normally uses a manual wheelchair when at home. CSW confirmed with [facility] that [facility] can accept tomorrow. CSW changed facility on auth. A covid test is needed within 24 hours of DC. [id number]; approval dates changed to 6/17 -- 6/21.   Expected Discharge Plan: Skilled Nursing Facility Barriers to Discharge: Continued Medical Work up  Expected Discharge Plan and Services Expected Discharge Plan: Skilled Nursing Facility In-house Referral: Clinical Social Work     Living arrangements for the past 2 months: Single Family Home Expected Discharge Date: 07/18/20                                     Social Determinants of Health (SDOH) Interventions    Readmission Risk Interventions No flowsheet data found.

## 2020-07-19 NOTE — Progress Notes (Signed)
Pt HR fluctuating going up to 140-150s for a few minutes at a time.  Currently WNL in 70-80s.  Notified cardiology PA.  No new orders at this time.

## 2020-07-19 NOTE — Progress Notes (Signed)
Occupational Therapy Treatment Patient Details Name: Frank Avila MRN: [id number] DOB: 1928/12/11 Today's Date: 07/19/2020    History of present illness 87 yo admitted 6/3 after syncope while in [facility] which pt slid out of WC getting his LLE caught. He was bradycardic with 2nd degree AV block resolved with medication. pt found to have LLE tibia fx treated non operatively. PMhx: HTN, HLD, CAD s/p CABG, spinal muscular dystrophy (diagnosed when he was 87 y/o).   OT comments  Pt progressing towards OT goals, able to progress sliding board transfers from slightly elevated bed to recliner with min guard. Pt with improving sitting balance EOB, as well as UB strength via chair pushups.  Positioned for comfort in recliner with head slightly reclined. Pt with planned pacemaker placement this afternoon with anticipated UE precautions that will impact ADL/transfer abilities. Will follow-up for OT re-assessment after procedure.   HR 70s with activity, 60s at rest BP WFL after transfer   Follow Up Recommendations  SNF;Supervision/Assistance - 24 hour    Equipment Recommendations  Other (comment) (well equipped)    Recommendations for Other Services      Precautions / Restrictions Precautions Precautions: Fall Precaution Comments: per pt no muscular control in LLE and foot drop in RLE, sometimes poor trunk control Restrictions Weight Bearing Restrictions: Yes RLE Weight Bearing: Weight bearing as tolerated LLE Weight Bearing: Non weight bearing       Mobility Bed Mobility Overal bed mobility: Needs Assistance Bed Mobility: Supine to Sit     Supine to sit: Mod assist     General bed mobility comments: Assist with LEs to EOB, moderate assist to elevate trunk    Transfers Overall transfer level: Needs assistance Equipment used: Sliding board Transfers: Lateral/Scoot Transfers          Lateral/Scoot Transfers: Min guard;From elevated surface General transfer comment: min guard  from slightly elevated bed to recliner via sliding board. Cues for posturing and scooting backwards    Balance Overall balance assessment: Needs assistance Sitting-balance support: Feet supported;No upper extremity supported Sitting balance-Leahy Scale: Fair                                     ADL either performed or assessed with clinical judgement   ADL Overall ADL's : Needs assistance/impaired                 Upper Body Dressing : Set up;Sitting Upper Body Dressing Details (indicate cue type and reason): Setup to don gown around back sitting EOB                   General ADL Comments: Session focused on transfer training progression with improving sitting balance and UE strength noted. Reinforced chair push ups for strength/endurance challenge     Vision   Vision Assessment?: No apparent visual deficits   Perception     Praxis      Cognition Arousal/Alertness: Awake/alert Behavior During Therapy: WFL for tasks assessed/performed Overall Cognitive Status: Within Functional Limits for tasks assessed                                 General Comments: Pt with improving insight into deficits, improved attention to task this date; pleasant and participatory - follows all commands (increased time or repetition due to [facility])  Exercises Other Exercises Other Exercises: Chair pushups   Shoulder Instructions       General Comments      Pertinent Vitals/ Pain       Pain Assessment: Faces Faces Pain Scale: Hurts a little bit Pain Location: L LE with repositioning efforts Pain Descriptors / Indicators: Sore Pain Intervention(s): Monitored during session;Repositioned;Limited activity within patient's tolerance  Home Living                                          Prior Functioning/Environment              Frequency  Min 2X/week        Progress Toward Goals  OT Goals(current goals can now be  found in the care plan section)  Progress towards OT goals: Progressing toward goals  Acute Rehab OT Goals Patient Stated Goal: go to [facility] for rehab then return home OT Goal Formulation: With patient Time For Goal Achievement: 07/24/20 Potential to Achieve Goals: Good ADL Goals Pt Will Perform Eating: with modified independence;with adaptive utensils;sitting Pt Will Perform Grooming: with modified independence;sitting Pt Will Perform Lower Body Dressing: with min assist;sitting/lateral leans Pt Will Transfer to Toilet: with mod assist Pt/caregiver will Perform Home Exercise Program: Increased strength;Both right and left upper extremity;With theraband;With theraputty;Independently;With written HEP provided  Plan Discharge plan remains appropriate    Co-evaluation                 AM-PAC OT "6 Clicks" Daily Activity     Outcome Measure   Help from another person eating meals?: A Little Help from another person taking care of personal grooming?: A Little Help from another person toileting, which includes using toliet, bedpan, or urinal?: Total Help from another person bathing (including washing, rinsing, drying)?: A Lot Help from another person to put on and taking off regular upper body clothing?: A Little Help from another person to put on and taking off regular lower body clothing?: A Lot 6 Click Score: 14    End of Session Equipment Utilized During Treatment: Gait belt;Other (comment) (sliding board)  OT Visit Diagnosis: Muscle weakness (generalized) (M62.81);History of falling (Z91.81)   Activity Tolerance Patient tolerated treatment well   Patient Left in chair;with call bell/phone within reach   Nurse Communication Mobility status;Other (comment) (HR, BP)        Time: 1638-4665 OT Time Calculation (min): 27 min  Charges: OT General Charges $OT Visit: 1 Visit OT Treatments $Therapeutic Activity: 23-37 mins  Bradd Canary, OTR/L Acute Rehab Services Office:  [phone]    Lorre Munroe 07/19/2020, 1:18 PM

## 2020-07-20 ENCOUNTER — Inpatient Hospital Stay (HOSPITAL_COMMUNITY): Payer: No Typology Code available for payment source

## 2020-07-20 ENCOUNTER — Encounter (HOSPITAL_COMMUNITY): Payer: Self-pay | Admitting: Cardiology

## 2020-07-20 LAB — BASIC METABOLIC PANEL
Anion gap: 10 (ref 5–15)
BUN: 49 mg/dL — ABNORMAL HIGH (ref 8–23)
CO2: 23 mmol/L (ref 22–32)
Calcium: 8.7 mg/dL — ABNORMAL LOW (ref 8.9–10.3)
Chloride: 101 mmol/L (ref 98–111)
Creatinine, Ser: 1.88 mg/dL — ABNORMAL HIGH (ref 0.61–1.24)
GFR, Estimated: 34 mL/min — ABNORMAL LOW (ref >60)
Glucose, Bld: 136 mg/dL — ABNORMAL HIGH (ref 70–99)
Potassium: 4.4 mmol/L (ref 3.5–5.1)
Sodium: 134 mmol/L — ABNORMAL LOW (ref 135–145)

## 2020-07-20 LAB — CBC
HCT: 31.7 % — ABNORMAL LOW (ref 39.0–52.0)
Hemoglobin: 10.8 g/dL — ABNORMAL LOW (ref 13.0–17.0)
MCH: 31.3 pg (ref 26.0–34.0)
MCHC: 34.1 g/dL (ref 30.0–36.0)
MCV: 91.9 fL (ref 80.0–100.0)
Platelets: 515 10*3/uL — ABNORMAL HIGH (ref 150–400)
RBC: 3.45 MIL/uL — ABNORMAL LOW (ref 4.22–5.81)
RDW: 12.4 % (ref 11.5–15.5)
WBC: 10.2 10*3/uL (ref 4.0–10.5)
nRBC: 0 % (ref 0.0–0.2)

## 2020-07-20 LAB — SARS CORONAVIRUS 2 (TAT 6-24 HRS): SARS Coronavirus 2: NEGATIVE

## 2020-07-20 MED ORDER — MORPHINE SULFATE (PF) 2 MG/ML IV SOLN: 2.0000 mg | Freq: Once | INTRAVENOUS | Status: DC

## 2020-07-20 MED FILL — Midazolam HCl Inj 5 MG/5ML (Base Equivalent): INTRAMUSCULAR | Qty: 5 | Status: AC

## 2020-07-20 MED FILL — Vancomycin HCl IV Soln 1000 MG/200ML (Base Equivalent): INTRAVENOUS | Qty: 200 | Status: AC

## 2020-07-20 NOTE — TOC Transition Note (Signed)
Transition of Care [facility]) - CM/SW Discharge Note   Patient Details  Name: Frank Avila MRN: [id number] Date of Birth: 1928-11-21  Transition of Care [facility]) CM/SW Contact:  Carley Hammed, LCSWA Phone Number: 07/20/2020, 10:05 AM   Clinical Narrative:    Pt to be transported to [facility] [place] via [facility]. Nurse to call report to [phone] ext [phone]   Final next level of care: Skilled Nursing Facility Barriers to Discharge: Barriers Resolved   Patient Goals and CMS Choice Patient states their goals for this hospitalization and ongoing recovery are:: SNF CMS Medicare.gov Compare Post Acute Care list provided to:: Patient Choice offered to / list presented to : Patient  Discharge Placement              Patient chooses bed at: [facility], [place] Patient to be transferred to facility by: [facility] Name of family member notified: Lynda Patient and family notified of of transfer: 07/20/20  Discharge Plan and Services In-house Referral: Clinical Social Work                                   Social Determinants of Health (SDOH) Interventions     Readmission Risk Interventions No flowsheet data found.

## 2020-07-20 NOTE — Discharge Summary (Signed)
Physician Discharge Summary  Kele Barthelemy Kleier WUJ:[id number] DOB: 10/05/1928 DOA: 07/06/2020  PCP: No primary care provider on file.  Admit date: 07/06/2020 Discharge date: 07/20/2020  Admitted From: Home  Disposition:  SNF   Recommendations for Outpatient Follow-up:  Follow-up Gastroenterology Dr. Meridee Score in 2-4 weeks for nausea  Follow up with Electrophysiology Dr. Graciela Husbands as directed Needs Chem-12, magnesium, CBC in about 1 week at facility Will need follow-up with Ortho trauma specialists, [name], PA         Home Health: N/A  Equipment/Devices: TBD at SNF  Discharge Condition: Improving  CODE STATUS: FULL Diet recommendation: Low sodium  Brief/Interim Summary: Mr. Bache is a 87 year old M with spinal muscular dystrophy, wheelchair-bound, lives independently, HTN, CAD status post CABG 1993, PCI 2012, and hyperlipidemia who presented with fall.  When he developed lightheadedness, passed out woke up on the ground.  In the ER at [facility], bradycardic, hypotensive pulses 20s and 30s, given atropine and a transvenous pacing wire was placed, found to have left ankle fracture.       PRINCIPAL HOSPITAL DIAGNOSIS: Symptomatic bradycardia    Discharge Diagnoses:  Bradycardia Propranolol held, the transvenous pacer was maintained, and his bradycardia resolved.    The patient did have 2 further transient bradycardic episodes during the hospitalization in the setting of nausea and emesis, electrophysiology were consulted again,  Successful dual chamber permanent pacemaker implantation on 07/19/2020 By Dr, Sheria Lang T. Lalla Brothers, MD, [facility], [facility] Tolerated procedure well-cleared for discharge  Nausea H. Pylori not obtained by time of discharge.  Has follow up planned with Dr. Meridee Score for chronic nausea.    Acute gout flare Patient noted to have right knee swelling redness and pain, arthrocentesis performed MSU crystals visualized, and he was discharged on  colchicine.   Left tib-fib fracture This was present on admission, patient went to the OR with Dr. Marcello Fennel, had ORIF, no postoperative complications, nonweightbearing left leg, follow-up with orthopedic as an outpatient.   Possible UTI Patient was treated with Bactrim, no recurrence of fever.  Essential tremor Propranolol stopped, primidone continued.   Coronary artery disease Hypertension  CKD stage IIIb AKI ruled out    Discharge Instructions  Discharge Instructions     (HEART FAILURE PATIENTS) Call MD:  Anytime you have any of the following symptoms: 1) 3 pound weight gain in 24 hours or 5 pounds in 1 week 2) shortness of breath, with or without a dry hacking cough 3) swelling in the hands, feet or stomach 4) if you have to sleep on extra pillows at night in order to breathe.   Complete by: As directed    Activity as tolerated - No restrictions   Complete by: As directed    Call MD for:  difficulty breathing, headache or visual disturbances   Complete by: As directed    Call MD for:  persistant dizziness or light-headedness   Complete by: As directed    Call MD for:  persistant nausea and vomiting   Complete by: As directed    Call MD for:  redness, tenderness, or signs of infection (pain, swelling, redness, odor or green/yellow discharge around incision site)   Complete by: As directed    Call MD for:  severe uncontrolled pain   Complete by: As directed    Call MD for:  temperature >100.4   Complete by: As directed    Diet - low sodium heart healthy   Complete by: As directed    Discharge instructions  Complete by: As directed    Per cardiology ....   Discharge wound care:   Complete by: As directed    Per RN instructions   Increase activity slowly   Complete by: As directed    Increase activity slowly   Complete by: As directed    No wound care   Complete by: As directed       Allergies as of 07/20/2020       Reactions   Beta Adrenergic Blockers Other  (See Comments)   Severe bradycardia on 07/2020 admission requiring pacemaker   Codeine Other (See Comments)   Unknown reaction   Penicillins Other (See Comments)   Unknown reaction        Medication List     STOP taking these medications    propranolol 20 MG tablet Commonly known as: INDERAL       TAKE these medications    acetaminophen 500 MG tablet Commonly known as: TYLENOL Take 500 mg by mouth See admin instructions. Take one tablet (500 mg) by mouth three times daily - morning, supper and bedtime   amLODipine 5 MG tablet Commonly known as: NORVASC Take 5 mg by mouth every morning.   atorvastatin 20 MG tablet Commonly known as: LIPITOR Take 20 mg by mouth every morning.   cetirizine 10 MG tablet Commonly known as: ZYRTEC Take 10 mg by mouth at bedtime.   clopidogrel 75 MG tablet Commonly known as: PLAVIX Take 75 mg by mouth every morning.   colchicine 0.6 MG tablet Take 1 tablet (0.6 mg total) by mouth daily.   donepezil 10 MG tablet Commonly known as: ARICEPT Take 10 mg by mouth 2 (two) times daily.   Fish Oil 1000 MG Caps Take 1,000 mg by mouth See admin instructions. Take one capsule (1000 mg) by mouth three times daily - morning, supper and bedtime   furosemide 40 MG tablet Commonly known as: LASIX Take 40 mg by mouth every Monday, Wednesday, and Friday.   gabapentin 400 MG capsule Commonly known as: NEURONTIN Take 800 mg by mouth daily after supper.   melatonin 3 MG Tabs tablet Take 1 tablet (3 mg total) by mouth at bedtime as needed.   METAMUCIL PO Take 45 mLs by mouth See admin instructions. Mix 3 tbsp (45 ml) in 18 oz water and drink nightly (with 1 tbsp miralax powder)   MIRALAX PO Take 15 mLs by mouth See admin instructions. Mix 1 tbsp (15 ml) in 18 oz water and drink nightly (with 3 tbsp psyllium fiber)   montelukast 10 MG tablet Commonly known as: SINGULAIR Take 10 mg by mouth every morning.   multivitamin with minerals Tabs  tablet Take 1 tablet by mouth every morning.   pantoprazole 40 MG tablet Commonly known as: PROTONIX Take 1 tablet (40 mg total) by mouth daily at 6 (six) AM.   primidone 50 MG tablet Commonly known as: MYSOLINE Take 0.5 tablets (25 mg total) by mouth at bedtime.   senna-docusate 8.6-50 MG tablet Commonly known as: Senokot-S Take 1 tablet by mouth at bedtime as needed for mild constipation or moderate constipation.   tamsulosin 0.4 MG Caps capsule Commonly known as: FLOMAX Take 0.4 mg by mouth at bedtime.   traMADol 50 MG tablet Commonly known as: ULTRAM Take 1 tablet (50 mg total) by mouth every 12 (twelve) hours as needed for moderate pain.   vitamin B-12 500 MCG tablet Commonly known as: CYANOCOBALAMIN Take 1,000 mcg by mouth every morning.  Discharge Care Instructions  (From admission, onward)           Start     Ordered   07/20/20 0000  Discharge wound care:       Comments: Per RN instructions   07/20/20 1610            Follow-up Information     Myrene Galas, MD. Schedule an appointment as soon as possible for a visit.   Specialty: Orthopedic Surgery Contact information: [address] [place] [address] [phone]         Duke Salvia, MD. Schedule an appointment as soon as possible for a visit in 1 month(s).   Specialty: Cardiology Contact information: [address] [place] [address] [phone]         Mansouraty, Netty Starring., MD. Schedule an appointment as soon as possible for a visit in 1 month(s).   Specialties: Gastroenterology, Internal Medicine Contact information: [address] [place] [address] [phone]         [facility] [address] Office Follow up.   Specialty: Cardiology Why: 07/31/20 @ 10:00AM, wound check visit (pacemaker) Contact information: [address] [place] [address] [phone]        Lanier Prude, MD Follow up.   Specialties: Cardiology, Radiology Why: 10/23/20 @ 11:45AM Contact information: [address] [place] [address] [phone]                Allergies  Allergen Reactions   Beta Adrenergic Blockers Other (See Comments)    Severe bradycardia on 07/2020 admission requiring pacemaker   Codeine Other (See Comments)    Unknown reaction   Penicillins Other (See Comments)    Unknown reaction    Consultations: Cardiology Electrophysiology Gastroenterology Orthopedics   Procedures/Studies: DG Ankle Complete Left  Result Date: 07/09/2020 CLINICAL DATA:  Left ankle fracture EXAM: LEFT ANKLE COMPLETE - 3+ VIEW COMPARISON:  CT and radiograph 07/08/2020 FINDINGS: Unchanged alignment of the minimally displaced distal tibia and fibular fractures in splint. Diffuse osteopenia. Unchanged dense joint distension of the tibiotalar joint. IMPRESSION: Unchanged alignment of the minimally displaced distal tibia and fibular fractures in splint. Diffuse osteopenia. Electronically Signed   By: Caprice Renshaw   On: 07/09/2020 09:41   CT ANKLE LEFT WO CONTRAST  Result Date: 07/08/2020 CLINICAL DATA:  Distal tibial fracture. EXAM: CT OF THE LEFT ANKLE WITHOUT CONTRAST TECHNIQUE: Multidetector CT imaging of the left ankle was performed according to the standard protocol. Multiplanar CT image reconstructions were also generated. COMPARISON:  Radiograph 07/08/2020 and CT scan 08/31/2017 FINDINGS: Bones/Joint/Cartilage Acute oblique fracture extending from the medial distal tibial diaphysis into the lateral distal tibial metaphysis along the margin of the proximal tibiofibular articulation. Nondisplaced transverse fracture of the distal fibula metaphysis. No definite fracture involvement of the tibial plafond and. There is some chronic deformity of the medial malleolus not appreciably changed from 08/31/2017 probably related to an old injury. Abnormal hyperdense nodularity in the  tibiotalar joint, talofibular joint, and distal tibiofibular articulation as shown on the 08/31/2017 exam, probably reflecting pigmented villonodular synovitis, and associated with some subtle erosive findings. Degenerative loss of articular space along the subtalar joint. Ligaments Suboptimally assessed by CT. Muscles and Tendons No tendon entrapment. Severe atrophy of the visualized musculature in the mid and lower calf. Soft tissues Subcutaneous edema along the distal calf and dorsally in the ankle extending into the foot. Atherosclerotic vascular calcifications. Clips  along the medial calf likely from prior saphenous vein harvest. IMPRESSION: 1. Acute oblique fracture extending from the medial distal tibial diaphysis to the lateral distal tibial metaphysis. 2. Acute nondisplaced transverse fracture of the distal fibular metaphysis. 3. Chronic hyperdense nodularity in the ankle joint compatible with pigmented villonodular synovitis. 4. Severe muscular atrophy in the calf. 5. Subcutaneous edema in the distal calf, ankle, and dorsal foot. 6. Atherosclerosis. Electronically Signed   By: Gaylyn Rong M.D.   On: 07/08/2020 15:25   DG Chest Port 1 View  Result Date: 07/07/2020 CLINICAL DATA:  Hypertension and syncope EXAM: PORTABLE CHEST 1 VIEW COMPARISON:  July 06, 2020 FINDINGS: Apparent pacemaker lead from right jugular approach with tip in region of right ventricle. No edema or airspace opacity. Heart is upper normal in size with pulmonary vascularity normal. Patient is status post coronary artery bypass grafting. There is aortic atherosclerosis. No pneumothorax. No bone lesions. IMPRESSION: Apparent pacemaker lead tip in right ventricle region. No pneumothorax. No edema or airspace opacity. Heart upper normal in size with postoperative changes. Aortic Atherosclerosis (ICD10-I70.0). Electronically Signed   By: Bretta Bang III M.D.   On: 07/07/2020 08:26   DG Knee Complete 4 Views Right  Result  Date: 07/11/2020 CLINICAL DATA:  Right knee pain and swelling, fall EXAM: RIGHT KNEE - COMPLETE 4+ VIEW COMPARISON:  None. FINDINGS: Four view radiograph right knee demonstrates normal alignment. No fracture or dislocation. Joint spaces are not optimally profiled but appear preserved. Moderate right knee effusion is present. Vascular calcifications are seen within the posterior soft tissues. IMPRESSION: Moderate effusion.  No fracture or dislocation. Electronically Signed   By: Helyn Numbers MD   On: 07/11/2020 03:52   DG Tibia/Fibula Left Port  Result Date: 07/18/2020 CLINICAL DATA:  Left lower leg fracture EXAM: PORTABLE LEFT TIBIA AND FIBULA - 2 VIEW COMPARISON:  07/09/2020 FINDINGS: Fracture distal tibia with mild angulation and mild displacement unchanged from the prior study. No fracture of the fibula. Casting of the lower leg. IMPRESSION: Mildly displaced and angulated fracture distal tibia unchanged from the prior examination. Electronically Signed   By: Marlan Palau M.D.   On: 07/18/2020 11:42   DG Ankle Left Port  Result Date: 07/08/2020 CLINICAL DATA:  Pain and swelling EXAM: PORTABLE LEFT ANKLE - 2 VIEW COMPARISON:  None. FINDINGS: Frontal and lateral views were obtained. Underlying osteoporosis. There is a spiral fracture of the distal tibial diaphysis extending to the level of the diaphysis-metaphysis junction. Alignment near anatomic. No other fracture. There is a joint effusion. There is moderate generalized joint space narrowing. No erosion. Ankle mortise appears intact. There are multiple foci of arterial vascular calcification. IMPRESSION: Osteoporosis. Spiral fracture distal tibia with alignment near anatomic. No other fracture. There is a joint effusion which potentially could indicate superimposed ligamentous injury. Generalized joint space narrowing. Multiple foci of arterial vascular calcification noted. These results will be called to the ordering clinician or representative by the  Radiologist Assistant, and communication documented in the PACS or [employer]. Electronically Signed   By: Bretta Bang III M.D.   On: 07/08/2020 12:11   ECHOCARDIOGRAM COMPLETE  Result Date: 07/07/2020    ECHOCARDIOGRAM REPORT   Patient Name:   GURPREET MIKHAIL Date of Exam: 07/07/2020 Medical Rec #:  [id number]        Height:       68.0 in Accession #:    [id number]       Weight:  175.3 lb Date of Birth:  Feb 22, 1928        BSA:          1.932 m Patient Age:    87 years         BP:           121/56 mmHg Patient Gender: M                HR:           58 bpm. Exam Location:  Inpatient Procedure: 2D Echo, Color Doppler and Cardiac Doppler Indications:    2nd Degree Heart Block i44.1  History:        Patient has prior history of Echocardiogram examinations, most                 recent 04/07/2016. CAD. Prior performed at [facility].  Sonographer:    Irving Burton Senior RDCS Referring Phys: [id number] KELLY E ARPS IMPRESSIONS  1. Left ventricular ejection fraction, by estimation, is 60 to 65%. The left ventricle has normal function. The left ventricle has no regional wall motion abnormalities. Left ventricular diastolic parameters are consistent with Grade II diastolic dysfunction (pseudonormalization). Elevated left atrial pressure.  2. Right ventricular systolic function is normal. The right ventricular size is normal.  3. The mitral valve is normal in structure. No evidence of mitral valve regurgitation. No evidence of mitral stenosis.  4. The aortic valve is normal in structure. Aortic valve regurgitation is not visualized. Mild aortic valve sclerosis is present, with no evidence of aortic valve stenosis.  5. The inferior vena cava is normal in size with greater than 50% respiratory variability, suggesting right atrial pressure of 3 mmHg. FINDINGS  Left Ventricle: Left ventricular ejection fraction, by estimation, is 60 to 65%. The left ventricle has normal function. The left ventricle has no regional wall motion  abnormalities. The left ventricular internal cavity size was normal in size. There is  no left ventricular hypertrophy. Abnormal (paradoxical) septal motion, consistent with RV pacemaker. Left ventricular diastolic parameters are consistent with Grade II diastolic dysfunction (pseudonormalization). Elevated left atrial pressure. Right Ventricle: The right ventricular size is normal. No increase in right ventricular wall thickness. Right ventricular systolic function is normal. Left Atrium: Left atrial size was normal in size. Right Atrium: Right atrial size was normal in size. Pericardium: There is no evidence of pericardial effusion. Mitral Valve: The mitral valve is normal in structure. Mild to moderate mitral annular calcification. No evidence of mitral valve regurgitation. No evidence of mitral valve stenosis. Tricuspid Valve: The tricuspid valve is normal in structure. Tricuspid valve regurgitation is not demonstrated. No evidence of tricuspid stenosis. Aortic Valve: The aortic valve is normal in structure. Aortic valve regurgitation is not visualized. Mild aortic valve sclerosis is present, with no evidence of aortic valve stenosis. Pulmonic Valve: The pulmonic valve was normal in structure. Pulmonic valve regurgitation is not visualized. No evidence of pulmonic stenosis. Aorta: The aortic root is normal in size and structure. Venous: The inferior vena cava is normal in size with greater than 50% respiratory variability, suggesting right atrial pressure of 3 mmHg. IAS/Shunts: No atrial level shunt detected by color flow Doppler. Additional Comments: A device lead is visualized in the right ventricle.  LEFT VENTRICLE PLAX 2D LVIDd:         3.80 cm  Diastology LVIDs:         2.70 cm  LV e' medial:    5.33 cm/s LV PW:  1.00 cm  LV E/e' medial:  17.9 LV IVS:        1.40 cm  LV e' lateral:   8.38 cm/s LVOT diam:     1.80 cm  LV E/e' lateral: 11.4 LV SV:         56 LV SV Index:   29 LVOT Area:     2.54 cm   RIGHT VENTRICLE RV S prime:     9.36 cm/s TAPSE (M-mode): 1.7 cm LEFT ATRIUM             Index       RIGHT ATRIUM           Index LA diam:        2.40 cm 1.24 cm/m  RA Area:     19.10 cm LA Vol (A2C):   44.8 ml 23.19 ml/m RA Volume:   51.10 ml  26.45 ml/m LA Vol (A4C):   28.5 ml 14.75 ml/m LA Biplane Vol: 38.4 ml 19.87 ml/m  AORTIC VALVE LVOT Vmax:   99.80 cm/s LVOT Vmean:  70.800 cm/s LVOT VTI:    0.219 m  AORTA Ao Root diam: 4.00 cm MITRAL VALVE MV Area (PHT): 2.90 cm     SHUNTS MV Decel Time: 262 msec     Systemic VTI:  0.22 m MV E velocity: 95.50 cm/s   Systemic Diam: 1.80 cm MV A velocity: 101.00 cm/s MV E/A ratio:  0.95 Mihai Croitoru MD Electronically signed by Thurmon Fair MD Signature Date/Time: 07/07/2020/9:10:05 AM    Final       Subjective: No headache, chest pain, dyspnea, leg swelling, orthopnea.  Discharge Exam: Vitals:   07/19/20 2329 07/20/20 0339  BP: (!) 129/58 134/65  Pulse: 68 63  Resp: 20 14  Temp: 98.1 F (36.7 C) (!) 97.5 F (36.4 C)  SpO2: 96% 94%   Vitals:   07/19/20 2000 07/19/20 2200 07/19/20 2329 07/20/20 0339  BP: (!) 137/53 (!) 129/58 (!) 129/58 134/65  Pulse: 68 63 68 63  Resp: 13 12 20 14   Temp:   98.1 F (36.7 C) (!) 97.5 F (36.4 C)  TempSrc:   Oral Oral  SpO2: 95% 94% 96% 94%  Weight:      Height:       No changes-stable Hospital General: Pt is alert, awake, not in acute distress, sitting up in recliner diffuse loss of muscle mass Cardiovascular: RRR, nl S1-S2, no murmurs appreciated.   No LE edema.   Respiratory: Normal respiratory rate and rhythm.  CTAB without rales or wheezes. Abdominal: Abdomen soft and non-tender.  No distension or HSM.   Neuro/Psych: Strength symmetric in upper and lower extremities but severely generally weak.  Judgment and insight appear normal.   The results of significant diagnostics from this hospitalization (including imaging, microbiology, ancillary and laboratory) are listed below for reference.      Microbiology: Recent Results (from the past 240 hour(s))  Culture, blood (Routine X 2) w Reflex to ID Panel     Status: None   Collection Time: 07/10/20  8:02 AM   Specimen: BLOOD  Result Value Ref Range Status   Specimen Description BLOOD BLOOD RIGHT HAND  Final   Special Requests   Final    BOTTLES DRAWN AEROBIC AND ANAEROBIC Blood Culture adequate volume   Culture   Final    NO GROWTH 5 DAYS Performed at [facility], [address], [place], [place]    Report Status 07/15/2020 FINAL  Final  Anaerobic culture w Gram Stain     Status: None   Collection Time: 07/11/20 11:16 AM   Specimen: Synovium; Synovial Fluid  Result Value Ref Range Status   Specimen Description SYNOVIAL FLUID  Final   Special Requests RIGHT KNEE  Final   Gram Stain NO WBC SEEN NO ORGANISMS SEEN   Final   Culture   Final    NO ANAEROBES ISOLATED Performed at [facility], [address], [place], [place] [address]    Report Status 07/16/2020 FINAL  Final  Body fluid culture w Gram Stain     Status: None   Collection Time: 07/11/20 11:17 AM   Specimen: Synovium; Synovial Fluid  Result Value Ref Range Status   Specimen Description SYNOVIAL FLUID  Final   Special Requests RIGHT KNEE  Final   Gram Stain   Final    RARE WBC PRESENT, PREDOMINANTLY PMN NO ORGANISMS SEEN    Culture   Final    NO GROWTH 3 DAYS Performed at [facility], [address] [place], [place] [address]    Report Status 07/14/2020 FINAL  Final  SARS CORONAVIRUS 2 (TAT 6-24 HRS) Nasopharyngeal Nasopharyngeal Swab     Status: None   Collection Time: 07/16/20  3:20 PM   Specimen: Nasopharyngeal Swab  Result Value Ref Range Status   SARS Coronavirus 2 NEGATIVE NEGATIVE Final    Comment: (NOTE) SARS-CoV-2 target nucleic acids are NOT DETECTED.  The SARS-CoV-2 RNA is generally detectable in upper and lower respiratory specimens during the acute phase of infection. Negative results do not preclude  SARS-CoV-2 infection, do not rule out co-infections with other pathogens, and should not be used as the sole basis for treatment or other patient management decisions. Negative results must be combined with clinical observations, patient history, and epidemiological information. The expected result is Negative.  Fact Sheet for Patients: HairSlick.no  Fact Sheet for Healthcare Providers: quierodirigir.com  This test is not yet approved or cleared by the [place] FDA and  has been authorized for detection and/or diagnosis of SARS-CoV-2 by FDA under an Emergency Use Authorization (EUA). This EUA will remain  in effect (meaning this test can be used) for the duration of the COVID-19 declaration under Se ction 564(b)(1) of the Act, 21 U.S.C. section 360bbb-3(b)(1), unless the authorization is terminated or revoked sooner.  Performed at [facility], [address], [place], [place] [address]   SARS CORONAVIRUS 2 (TAT 6-24 HRS) Nasopharyngeal Nasopharyngeal Swab     Status: None   Collection Time: 07/18/20  7:41 AM   Specimen: Nasopharyngeal Swab  Result Value Ref Range Status   SARS Coronavirus 2 NEGATIVE NEGATIVE Final    Comment: (NOTE) SARS-CoV-2 target nucleic acids are NOT DETECTED.  The SARS-CoV-2 RNA is generally detectable in upper and lower respiratory specimens during the acute phase of infection. Negative results do not preclude SARS-CoV-2 infection, do not rule out co-infections with other pathogens, and should not be used as the sole basis for treatment or other patient management decisions. Negative results must be combined with clinical observations, patient history, and epidemiological information. The expected result is Negative.  Fact Sheet for Patients: HairSlick.no  Fact Sheet for Healthcare Providers: quierodirigir.com  This test is not  yet approved or cleared by the [place] FDA and  has been authorized for detection and/or diagnosis of SARS-CoV-2 by FDA under an Emergency Use Authorization (EUA). This EUA will remain  in effect (meaning this test can  be used) for the duration of the COVID-19 declaration under Se ction 564(b)(1) of the Act, 21 U.S.C. section 360bbb-3(b)(1), unless the authorization is terminated or revoked sooner.  Performed at [facility], [address] [place], [place] [id number]   Surgical PCR screen     Status: None   Collection Time: 07/18/20 11:41 PM   Specimen: Nasal Mucosa; Nasal Swab  Result Value Ref Range Status   MRSA, PCR NEGATIVE NEGATIVE Final   Staphylococcus aureus NEGATIVE NEGATIVE Final    Comment: (NOTE) The Xpert SA Assay (FDA approved for NASAL specimens in patients 85 years of age and older), is one component of a comprehensive surveillance program. It is not intended to diagnose infection nor to guide or monitor treatment. Performed at [facility], [address] [place], [place] [address]   SARS CORONAVIRUS 2 (TAT 6-24 HRS) Nasopharyngeal Nasopharyngeal Swab     Status: None   Collection Time: 07/19/20  7:19 PM   Specimen: Nasopharyngeal Swab  Result Value Ref Range Status   SARS Coronavirus 2 NEGATIVE NEGATIVE Final    Comment: (NOTE) SARS-CoV-2 target nucleic acids are NOT DETECTED.  The SARS-CoV-2 RNA is generally detectable in upper and lower respiratory specimens during the acute phase of infection. Negative results do not preclude SARS-CoV-2 infection, do not rule out co-infections with other pathogens, and should not be used as the sole basis for treatment or other patient management decisions. Negative results must be combined with clinical observations, patient history, and epidemiological information. The expected result is Negative.  Fact Sheet for Patients: HairSlick.nohttps://www.fda.gov/media/138098/download  Fact Sheet for Healthcare  Providers: quierodirigir.comhttps://www.fda.gov/media/138095/download  This test is not yet approved or cleared by the [place] FDA and  has been authorized for detection and/or diagnosis of SARS-CoV-2 by FDA under an Emergency Use Authorization (EUA). This EUA will remain  in effect (meaning this test can be used) for the duration of the COVID-19 declaration under Se ction 564(b)(1) of the Act, 21 U.S.C. section 360bbb-3(b)(1), unless the authorization is terminated or revoked sooner.  Performed at [facility], [address] [place], [place] [address]      Labs: BNP (last 3 results) No results for input(s): BNP in the last 8760 hours. Basic Metabolic Panel: Recent Labs  Lab 07/14/20 0938 07/15/20 0042 07/18/20 0202 07/20/20 0146  NA 130* 130* 132* 134*  K 4.3 4.6 4.3 4.4  CL 100 101 101 101  CO2 20* 21* 21* 23  GLUCOSE 138* 121* 97 136*  BUN 38* 45* 47* 49*  CREATININE 2.00* 2.18* 2.03* 1.88*  CALCIUM 8.2* 7.9* 8.8* 8.7*  PHOS 3.2 3.3  --   --    Liver Function Tests: Recent Labs  Lab 07/14/20 0938 07/15/20 0042 07/18/20 0202  AST  --   --  22  ALT  --   --  28  ALKPHOS  --   --  75  BILITOT  --   --  0.5  PROT  --   --  6.1*  ALBUMIN 2.2* 2.1* 2.5*   No results for input(s): LIPASE, AMYLASE in the last 168 hours. No results for input(s): AMMONIA in the last 168 hours. CBC: Recent Labs  Lab 07/15/20 0042 07/18/20 0202 07/20/20 0146  WBC 7.9 7.2 10.2  NEUTROABS 6.6 4.8  --   HGB 9.2* 10.5* 10.8*  HCT 27.5* 31.4* 31.7*  MCV 93.5 93.7 91.9  PLT 405* 515* 515*   Cardiac Enzymes: No results for input(s): CKTOTAL,  CKMB, CKMBINDEX, TROPONINI in the last 168 hours. BNP: Invalid input(s): POCBNP CBG: Recent Labs  Lab 07/16/20 1151 07/16/20 1551 07/16/20 2045 07/17/20 0615 07/18/20 1512  GLUCAP 95 103* 149* 98 115*   D-Dimer No results for input(s): DDIMER in the last 72 hours. Hgb A1c No results for input(s): HGBA1C in the last 72 hours. Lipid  Profile No results for input(s): CHOL, HDL, LDLCALC, TRIG, CHOLHDL, LDLDIRECT in the last 72 hours. Thyroid function studies No results for input(s): TSH, T4TOTAL, T3FREE, THYROIDAB in the last 72 hours.  Invalid input(s): FREET3 Anemia work up No results for input(s): VITAMINB12, FOLATE, FERRITIN, TIBC, IRON, RETICCTPCT in the last 72 hours. Urinalysis    Component Value Date/Time   COLORURINE YELLOW 07/10/2020 0957   APPEARANCEUR HAZY (A) 07/10/2020 0957   LABSPEC 1.012 07/10/2020 0957   PHURINE 6.0 07/10/2020 0957   GLUCOSEU NEGATIVE 07/10/2020 0957   HGBUR NEGATIVE 07/10/2020 0957   BILIRUBINUR NEGATIVE 07/10/2020 0957   KETONESUR NEGATIVE 07/10/2020 0957   PROTEINUR 100 (A) 07/10/2020 0957   NITRITE POSITIVE (A) 07/10/2020 0957   LEUKOCYTESUR LARGE (A) 07/10/2020 0957   Sepsis Labs Invalid input(s): PROCALCITONIN,  WBC,  LACTICIDVEN Microbiology Recent Results (from the past 240 hour(s))  Culture, blood (Routine X 2) w Reflex to ID Panel     Status: None   Collection Time: 07/10/20  8:02 AM   Specimen: BLOOD  Result Value Ref Range Status   Specimen Description BLOOD BLOOD RIGHT HAND  Final   Special Requests   Final    BOTTLES DRAWN AEROBIC AND ANAEROBIC Blood Culture adequate volume   Culture   Final    NO GROWTH 5 DAYS Performed at [facility], [address], [place], [place] [address]    Report Status 07/15/2020 FINAL  Final  Anaerobic culture w Gram Stain     Status: None   Collection Time: 07/11/20 11:16 AM   Specimen: Synovium; Synovial Fluid  Result Value Ref Range Status   Specimen Description SYNOVIAL FLUID  Final   Special Requests RIGHT KNEE  Final   Gram Stain NO WBC SEEN NO ORGANISMS SEEN   Final   Culture   Final    NO ANAEROBES ISOLATED Performed at [facility], [address] [place], [place] [address]    Report Status 07/16/2020 FINAL  Final  Body fluid culture w Gram Stain     Status: None   Collection Time: 07/11/20 11:17  AM   Specimen: Synovium; Synovial Fluid  Result Value Ref Range Status   Specimen Description SYNOVIAL FLUID  Final   Special Requests RIGHT KNEE  Final   Gram Stain   Final    RARE WBC PRESENT, PREDOMINANTLY PMN NO ORGANISMS SEEN    Culture   Final    NO GROWTH 3 DAYS Performed at [facility], [address], [place], [place] [address]    Report Status 07/14/2020 FINAL  Final  SARS CORONAVIRUS 2 (TAT 6-24 HRS) Nasopharyngeal Nasopharyngeal Swab     Status: None   Collection Time: 07/16/20  3:20 PM   Specimen: Nasopharyngeal Swab  Result Value Ref Range Status   SARS Coronavirus 2 NEGATIVE NEGATIVE Final    Comment: (NOTE) SARS-CoV-2 target nucleic acids are NOT DETECTED.  The SARS-CoV-2 RNA is generally detectable in upper and lower respiratory specimens during the acute phase of infection. Negative results do not preclude SARS-CoV-2 infection, do not rule out co-infections with other pathogens, and should not be  used as the sole basis for treatment or other patient management decisions. Negative results must be combined with clinical observations, patient history, and epidemiological information. The expected result is Negative.  Fact Sheet for Patients: HairSlick.no  Fact Sheet for Healthcare Providers: quierodirigir.com  This test is not yet approved or cleared by the [place] FDA and  has been authorized for detection and/or diagnosis of SARS-CoV-2 by FDA under an Emergency Use Authorization (EUA). This EUA will remain  in effect (meaning this test can be used) for the duration of the COVID-19 declaration under Se ction 564(b)(1) of the Act, 21 U.S.C. section 360bbb-3(b)(1), unless the authorization is terminated or revoked sooner.  Performed at [facility], [address], [place], [place] [address]   SARS CORONAVIRUS 2 (TAT 6-24 HRS) Nasopharyngeal Nasopharyngeal Swab     Status: None    Collection Time: 07/18/20  7:41 AM   Specimen: Nasopharyngeal Swab  Result Value Ref Range Status   SARS Coronavirus 2 NEGATIVE NEGATIVE Final    Comment: (NOTE) SARS-CoV-2 target nucleic acids are NOT DETECTED.  The SARS-CoV-2 RNA is generally detectable in upper and lower respiratory specimens during the acute phase of infection. Negative results do not preclude SARS-CoV-2 infection, do not rule out co-infections with other pathogens, and should not be used as the sole basis for treatment or other patient management decisions. Negative results must be combined with clinical observations, patient history, and epidemiological information. The expected result is Negative.  Fact Sheet for Patients: HairSlick.no  Fact Sheet for Healthcare Providers: quierodirigir.com  This test is not yet approved or cleared by the [place] FDA and  has been authorized for detection and/or diagnosis of SARS-CoV-2 by FDA under an Emergency Use Authorization (EUA). This EUA will remain  in effect (meaning this test can be used) for the duration of the COVID-19 declaration under Se ction 564(b)(1) of the Act, 21 U.S.C. section 360bbb-3(b)(1), unless the authorization is terminated or revoked sooner.  Performed at [facility], [address], [place], [place] [address]   Surgical PCR screen     Status: None   Collection Time: 07/18/20 11:41 PM   Specimen: Nasal Mucosa; Nasal Swab  Result Value Ref Range Status   MRSA, PCR NEGATIVE NEGATIVE Final   Staphylococcus aureus NEGATIVE NEGATIVE Final    Comment: (NOTE) The Xpert SA Assay (FDA approved for NASAL specimens in patients 24 years of age and older), is one component of a comprehensive surveillance program. It is not intended to diagnose infection nor to guide or monitor treatment. Performed at [facility], [address], [place], [place] [address]   SARS CORONAVIRUS 2  (TAT 6-24 HRS) Nasopharyngeal Nasopharyngeal Swab     Status: None   Collection Time: 07/19/20  7:19 PM   Specimen: Nasopharyngeal Swab  Result Value Ref Range Status   SARS Coronavirus 2 NEGATIVE NEGATIVE Final    Comment: (NOTE) SARS-CoV-2 target nucleic acids are NOT DETECTED.  The SARS-CoV-2 RNA is generally detectable in upper and lower respiratory specimens during the acute phase of infection. Negative results do not preclude SARS-CoV-2 infection, do not rule out co-infections with other pathogens, and should not be used as the sole basis for treatment or other patient management decisions. Negative results must be combined with clinical observations, patient history, and epidemiological information. The expected result is Negative.  Fact Sheet for Patients: HairSlick.no  Fact Sheet for Healthcare Providers: quierodirigir.com  This test is not yet approved or  cleared by the [place] and  has been authorized for detection and/or diagnosis of SARS-CoV-2 by FDA under an Emergency Use Authorization (EUA). This EUA will remain  in effect (meaning this test can be used) for the duration of the COVID-19 declaration under Se ction 564(b)(1) of the Act, 21 U.S.C. section 360bbb-3(b)(1), unless the authorization is terminated or revoked sooner.  Performed at [facility], [address] [place], [place] [address]      Time coordinating discharge: 25 minutes The [place] controlled substances registry was reviewed for this patient     30 Day Unplanned Readmission Risk Score    Flowsheet Row Admission (Current) from 07/06/2020 in [facility] 4E CV SURGICAL PROGRESSIVE CARE  30 Day Unplanned Readmission Risk Score (%) 21.17 Filed at 07/18/2020 1200       This score is the patient's risk of an unplanned readmission within 30 days of being discharged (0 -100%). The score is based on dignosis, age, lab data, medications,  orders, and past utilization.   Low:  0-14.9   Medium: 15-21.9   High: 22-29.9   Extreme: 30 and above             SIGNED:   Kendell Bane, MD  [facility] 07/20/2020, 7:16 AM

## 2020-07-20 NOTE — Progress Notes (Signed)
Physical Therapy Treatment Patient Details Name: Frank Avila MRN: [id number] DOB: November 08, 1928 Today's Date: 07/20/2020    History of Present Illness 87 yo admitted 6/3 after syncope while in [facility] which pt slid out of WC getting his LLE caught. He was bradycardic with 2nd degree AV block resolved with medication. pt found to have LLE tibia fx treated non operatively. 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 PMhx: HTN, HLD, CAD s/p CABG, spinal muscular dystrophy (diagnosed when he was 87 y/o).    PT Comments    Pt received in supine, NT in room for hygiene assist and pt agreeable to bed-level session as he is preparing to discharge soon. Pt performed rolling with mod/maxA and increased assist needed rolling to R side this date due to new LUE pacemaker precautions per Electrophysiology PA note, discussed with pt in detail and with teachback method, pt able to verbalize only transferring to chair toward R side and NWB LUE for transfers, no pushing/pulling for 6-8 weeks and limited L shoulder flexion/reaching 1st week, handout given to reinforce. Pt continues to benefit from PT services to progress toward functional mobility goals.  Follow Up Recommendations  SNF     Equipment Recommendations  None recommended by PT (defer to next location)    Recommendations for Other Services       Precautions / Restrictions Precautions Precautions: Fall Precaution Comments: L PPM precs no pushing/pulling 6-8 weeks and no lifting >shoulder height 1 week; PPM precs handout given Restrictions Weight Bearing Restrictions: Yes RUE Weight Bearing: Non weight bearing (not >10 lbs) RLE Weight Bearing: Weight bearing as tolerated LLE Weight Bearing: Non weight bearing Other Position/Activity Restrictions:  (discussed with EP PA, slide board transfers only to R side with no LUE useage for 6-8 weeks)    Mobility  Bed Mobility Overal bed mobility: Needs Assistance Bed Mobility: Rolling Rolling: Mod assist;Max assist          General bed mobility comments: modA to roll to L side and maxA to roll to R side as pt unable to push/pull with LUE for 6-8 weeks 2/2 PPM placement; totalA for posterior supine scoot to [facility]    Transfers                 General transfer comment: defer; pt about to discharge with PTAR  Ambulation/Gait                 Stairs             Wheelchair Mobility    Modified Rankin (Stroke Patients Only)       Balance       Sitting balance - Comments: UTA; pt about to discharge                                    Cognition Arousal/Alertness: Awake/alert Behavior During Therapy: WFL for tasks assessed/performed Overall Cognitive Status: Within Functional Limits for tasks assessed Area of Impairment: Attention;Following commands;Safety/judgement                   Current Attention Level: Selective   Following Commands: Follows one step commands with increased time;Follows one step commands consistently Safety/Judgement: Decreased awareness of safety;Decreased awareness of deficits     General Comments: Pt drowsy with very soft voice and VSS on RA and able to follow cues with increased time. Heavy session focus on LUE PPM precaution instruction and handout given in DC  paperwork to reinforce.      Exercises General Exercises - Upper Extremity Shoulder Flexion: AAROM;Left;5 reps (to ~70 deg only 2/2 PPM precs (pain-free range and not past 90 deg first week)) Elbow Flexion: AROM;Both;10 reps;Supine Elbow Extension: AROM;Both;10 reps;Supine Wrist Flexion: AROM;Both;5 reps;Sidelying General Exercises - Lower Extremity Ankle Circles/Pumps: AAROM;Right;10 reps;Supine Quad Sets: AROM;Right;5 reps;Supine Heel Slides: AAROM;Right;10 reps;Supine    General Comments General comments (skin integrity, edema, etc.): HR 70's bpm and SpO2 99% on RA      Pertinent Vitals/Pain Pain Assessment: Faces Faces Pain Scale: Hurts little more Pain  Location: LUE with flexion up to 70 deg (defer higher 2/2 precs) and mild R knee pain Pain Descriptors / Indicators: Sore Pain Intervention(s): Monitored during session    Home Living                      Prior Function            PT Goals (current goals can now be found in the care plan section) Acute Rehab PT Goals Patient Stated Goal: go to [facility] for rehab then return home PT Goal Formulation: With patient Time For Goal Achievement: 07/23/20 Progress towards PT goals: Progressing toward goals    Frequency    Min 3X/week      PT Plan Current plan remains appropriate    Co-evaluation              AM-PAC PT "6 Clicks" Mobility   Outcome Measure  Help needed turning from your back to your side while in a flat bed without using bedrails?: A Lot Help needed moving from lying on your back to sitting on the side of a flat bed without using bedrails?: A Lot Help needed moving to and from a bed to a chair (including a wheelchair)?: Total Help needed standing up from a chair using your arms (e.g., wheelchair or bedside chair)?: Total Help needed to walk in hospital room?: Total Help needed climbing 3-5 steps with a railing? : Total 6 Click Score: 8    End of Session Equipment Utilized During Treatment: Other (comment) (transfer pads) Activity Tolerance: Patient tolerated treatment well Patient left: in bed;with call bell/phone within reach;Other (comment);with nursing/sitter in room (preparing for DC, PTAR entering room) Nurse Communication: Mobility status;Other (comment);Precautions (LUE/LLE precs) PT Visit Diagnosis: Other abnormalities of gait and mobility (R26.89);Muscle weakness (generalized) (M62.81)     Time: 1497-0263 PT Time Calculation (min) (ACUTE ONLY): 11 min  Charges:  $Therapeutic Activity: 8-22 mins                     Chizuko Trine P., PTA Acute Rehabilitation Services Pager: [phone] Office: [phone]    Dorathy Kinsman Maynor Mwangi 07/20/2020,  11:23 AM

## 2020-07-20 NOTE — Progress Notes (Signed)
Report given to [facility] LPN at [facility].  Pt taken off telemetry and CCMD notified.  Pt's IV removed.   Pt left with all of their personal belongings. AVS sent with PTAR services.

## 2020-07-20 NOTE — Progress Notes (Signed)
Progress Note  Patient Name: Frank Avila Date of Encounter: 07/20/2020  [facility] Cardiologist: Dr. Judithe Modest  Subjective   Feeling tired and weak this morning, "to much moving around yesterday and today, makes me feel bad but Im OK" No CP, no SOB  Inpatient Medications    Scheduled Meds:  acetaminophen  500 mg Oral TID   amLODipine  10 mg Oral Daily   atorvastatin  20 mg Oral Daily   colchicine  0.6 mg Oral Daily   donepezil  10 mg Oral BID   furosemide  40 mg Oral Daily   gabapentin  800 mg Oral QPC supper   montelukast  10 mg Oral Daily    morphine injection  2 mg Intravenous Once   pantoprazole  40 mg Oral Q0600   polyethylene glycol  17 g Oral Daily   primidone  25 mg Oral QHS   psyllium  1 packet Oral Daily   sodium chloride flush  3 mL Intravenous Q12H   tamsulosin  0.4 mg Oral QHS   vitamin B-12  1,000 mcg Oral Daily   Continuous Infusions:   PRN Meds: acetaminophen, lidocaine, melatonin, metoCLOPramide, ondansetron (ZOFRAN) IV, prochlorperazine, senna-docusate, traMADol   Vital Signs    Vitals:   07/19/20 2000 07/19/20 2200 07/19/20 2329 07/20/20 0339  BP: (!) 137/53 (!) 129/58 (!) 129/58 134/65  Pulse: 68 63 68 63  Resp: 13 12 20 14   Temp:   98.1 F (36.7 C) (!) 97.5 F (36.4 C)  TempSrc:   Oral Oral  SpO2: 95% 94% 96% 94%  Weight:      Height:        Intake/Output Summary (Last 24 hours) at 07/20/2020 0806 Last data filed at 07/20/2020 0600 Gross per 24 hour  Intake 600 ml  Output 1700 ml  Net -1100 ml   Last 3 Weights 07/11/2020 07/09/2020 07/08/2020  Weight (lbs) 164 lb 10.9 oz 164 lb 7.4 oz 170 lb 13.7 oz  Weight (kg) 74.7 kg 74.6 kg 77.5 kg      Telemetry    AV pacing- Personally Reviewed  ECG    AV paced - Personally Reviewed  Physical Exam    GEN: No acute distress.   Neck: No JVD Cardiac: RRR, no murmurs, rubs, or gallops.  Respiratory: CTA b/l. GI: Soft, nontender, non-distended  MS: No edema; cast LLE Neuro:  known  baseline muscular dystrophy Psych: Normal affect   Pacer site is CDT, no bleeding or hematoma, small area of ecchymosis inferior aspect  Labs    High Sensitivity Troponin:  No results for input(s): TROPONINIHS in the last 720 hours.    Chemistry Recent Labs  Lab 07/14/20 0938 07/15/20 0042 07/18/20 0202 07/20/20 0146  NA 130* 130* 132* 134*  K 4.3 4.6 4.3 4.4  CL 100 101 101 101  CO2 20* 21* 21* 23  GLUCOSE 138* 121* 97 136*  BUN 38* 45* 47* 49*  CREATININE 2.00* 2.18* 2.03* 1.88*  CALCIUM 8.2* 7.9* 8.8* 8.7*  PROT  --   --  6.1*  --   ALBUMIN 2.2* 2.1* 2.5*  --   AST  --   --  22  --   ALT  --   --  28  --   ALKPHOS  --   --  75  --   BILITOT  --   --  0.5  --   GFRNONAA 31* 28* 31* 34*  ANIONGAP 10 8 10  10  Hematology Recent Labs  Lab 07/15/20 0042 07/18/20 0202 07/20/20 0146  WBC 7.9 7.2 10.2  RBC 2.94* 3.35* 3.45*  HGB 9.2* 10.5* 10.8*  HCT 27.5* 31.4* 31.7*  MCV 93.5 93.7 91.9  MCH 31.3 31.3 31.3  MCHC 33.5 33.4 34.1  RDW 12.1 12.3 12.4  PLT 405* 515* 515*    BNPNo results for input(s): BNP, PROBNP in the last 168 hours.   DDimer No results for input(s): DDIMER in the last 168 hours.   Radiology      Cardiac Studies   2D echo 07/2020 IMPRESSIONS   1. Left ventricular ejection fraction, by estimation, is 60 to 65%. The  left ventricle has normal function. The left ventricle has no regional  wall motion abnormalities. Left ventricular diastolic parameters are  consistent with Grade II diastolic  dysfunction (pseudonormalization). Elevated left atrial pressure.   2. Right ventricular systolic function is normal. The right ventricular  size is normal.   3. The mitral valve is normal in structure. No evidence of mitral valve  regurgitation. No evidence of mitral stenosis.   4. The aortic valve is normal in structure. Aortic valve regurgitation is  not visualized. Mild aortic valve sclerosis is present, with no evidence  of aortic valve  stenosis.   5. The inferior vena cava is normal in size with greater than 50%  respiratory variability, suggesting right atrial pressure of 3 mmHg.  Patient Profile     87 y.o. male male w/PMHx of CAD (CABG 1993, PCI to grafts 2012), HTN, HLD, spinal muscular dystrophy (uses motorized wheelchair)   Initially admitted to [facility] via [facility] where he was for syncope, he was found bradycardic in the 20's with reports of V bigemeny and other notes report some degree of AVblock described as second degree AVblock and reports of V bigemeny, in the environment of newly started propanolol (for tremor), temp pacing wire was placed, he was also found to have L tib-fib fx   Syncopal episode described as started to feel poorly became nauseous, diaphoretic, vomited and fainted sufferiing L tib/fib fracture (casted)   TTE noted LVEF 60-65%   Cardiology was consulted and his propanolol stopped EP also consulted seen by Dr. Ladona Ridgel at the time of his consult 07/07/20 he had regained conduction noting baseline RBBB, and suspected that he would not need pacing off BB tx   Started on primidone for his tremor Temp wire removed 07/08/20   Cardiology signed off 07/10/20 with no further bradycardia Recalled 07/13/20 with 2 transient bradycardic episodes, though had been nauseous all day and at least once with emesis and associated normal rates. We briefly discussed the case and in the environment of nausea suspected to have been vagal and monitoring for recurrent bradycardia without a potential tirigger was planned.   Cardiology followed the patient, note reports case discussed with prior cardiologist following and EP with no indication for pacing though it seems at patient request or demand that he get a PPM implant and EP is asked to revisit  EP has followed since  Assessment & Plan    Bradycardia   Initial presentation as far as I can tell was V bigeminy and subsequent bradycardia Though 2nd degree AVblock  was discussed as well AV conduction recovered off the propanolol (rx for his tremor)   07/13/20 2 episodes of transient sinus bradycardia Notes report quite nauseous this day and telemetry supports vagally mediated bradycardia   Nauseous again intermittently with some reports diarrhea as well have been  noted without bradycardia, felt to further supports bradycardia as 2/2 his nausea/vagally mediated  He was seen yesterday by Dr. Graciela Husbands, planned for [facility] AT upon discharge  07/18/20 he was seated in his chair reported feeling nauseous and poorly and quickly following this had a 11.3s  cond asystolic event followed by SB 42'P > 50s back to baseline.  S/p PPM implant yesterday with Dr. Lalla Brothers Site is stable Device check this AM with intact function CXR with stable lead position, no ptx LUE activity restrictions were reviewed with the patient, this is very important given he is non-ambulatory and uses his UE for transfers etc.  We discussed this at length yesterday as well. He is discharging to SNF at least initially. PLEASE MAKE SURE POST PACING ACTIVITY AND WOUND CARE INSTRUCTIONS GO WITH HIM AS BELOW (also are in the AVS)  NO PLAVIX FOR 5 DAYS  OK to discharge from EP perspective. EP follow up is in place   Activity No heavy lifting or vigorous activity with your left arm for 6 to 8 weeks.  Do not raise your left arm above your head for one week.  Gradually raise your affected arm as drawn below.             07/27/20                    07/28/20                    07/29/20                 07/30/20   NO DRIVING until cleared to at your wound check visit.  WOUND CARE Keep the wound area clean and dry.  Do not get this area wet , no showers until cleared to at your wound check visit The tape/steri-strips on your wound will fall off; do not pull them off.  No bandage is needed on the site.  DO  NOT apply any creams, oils, or ointments to the wound area. If you notice any drainage or discharge  from the wound, any swelling or bruising at the site, or you develop a fever > 101? F after you are discharged home, call the office at once.   2. L tib-fib fracture Casted  3. R knee gout 4. During this stay, ?poss UTI Treated with bactrim 5. Mucular dystrophy     Wheelchair bound     Lives at home alone 6. Tremor 7. CKD    For questions or updates, please contact [facility] Please consult [email or web address] for contact info under        Signed, Sheilah Pigeon, PA-C  07/20/2020, 8:06 AM

## 2020-07-31 ENCOUNTER — Ambulatory Visit: Payer: No Typology Code available for payment source

## 2020-08-09 ENCOUNTER — Other Ambulatory Visit: Payer: Self-pay

## 2020-08-09 ENCOUNTER — Ambulatory Visit (INDEPENDENT_AMBULATORY_CARE_PROVIDER_SITE_OTHER): Payer: Medicare HMO

## 2020-08-09 DIAGNOSIS — R001 Bradycardia, unspecified: Secondary | ICD-10-CM | POA: Diagnosis not present

## 2020-08-09 NOTE — Patient Instructions (Signed)

## 2020-08-10 LAB — CUP PACEART INCLINIC DEVICE CHECK
Battery Remaining Longevity: 103 mo
Brady Statistic RA Percent Paced: 67 %
Brady Statistic RV Percent Paced: 0 %
Date Time Interrogation Session: 20220707122813
Implantable Lead Implant Date: 20220616
Implantable Lead Implant Date: 20220616
Implantable Lead Location: 753859
Implantable Lead Location: 753860
Implantable Lead Model: 377171
Implantable Lead Model: 377171
Implantable Lead Serial Number: 8000374699
Implantable Lead Serial Number: 8000405951
Implantable Pulse Generator Implant Date: 20220616
Lead Channel Impedance Value: 565 Ohm
Lead Channel Impedance Value: 604 Ohm
Lead Channel Pacing Threshold Amplitude: 1.1 V
Lead Channel Pacing Threshold Amplitude: 1.3 V
Lead Channel Pacing Threshold Pulse Width: 0.4 ms
Lead Channel Pacing Threshold Pulse Width: 0.4 ms
Lead Channel Sensing Intrinsic Amplitude: 12.1 mV
Lead Channel Sensing Intrinsic Amplitude: 2.6 mV
Lead Channel Setting Pacing Amplitude: 3 V
Lead Channel Setting Pacing Amplitude: 3 V
Lead Channel Setting Pacing Pulse Width: 0.4 ms
Pulse Gen Model: 407145
Pulse Gen Serial Number: 70108841

## 2020-08-10 NOTE — Progress Notes (Signed)
Wound check appointment post PPM impant 07/19/20 for symptomatic bradycardia. Steri-strips removed. Wound without redness or edema. Incision edges approximated, wound well healed. Normal device function. Thresholds, sensing, and impedances consistent with implant measurements. Device programmed at 3.0 volts for appropriate safety margins for acute leads.[place] Poli distribution appropriate for patient and level of activity. No mode switches or high ventricular rates noted. Patient educated about wound care, arm mobility, lifting restrictions. Patient enrolled in remote transmission with next remote scheduled 10/22/20. 91 day ROV follow up with Dr. Lalla Brothers 10/23/20.

## 2020-10-22 ENCOUNTER — Ambulatory Visit (INDEPENDENT_AMBULATORY_CARE_PROVIDER_SITE_OTHER): Payer: Medicare HMO

## 2020-10-22 DIAGNOSIS — R001 Bradycardia, unspecified: Secondary | ICD-10-CM | POA: Diagnosis not present

## 2020-10-22 NOTE — Progress Notes (Signed)
Electrophysiology Office Follow up Visit Note:    Date:  10/23/2020   ID:  Frank Avila, DOB 31-May-1928, MRN [id number]  PCP:  Galvin Proffer, MD  [facility] Cardiologist:  None  [facility] Electrophysiologist:  Lanier Prude, MD    Interval History:    Frank Avila is a 87 y.o. male who presents for a follow up visit after his pacemaker implant on July 19, 2020 for syncope and symptomatic bradycardia.  He has done well since device implant.  Site is well-healed.     Past Medical History:  Diagnosis Date   Acute gout of knee, Right 07/12/2020   Bradycardia    CAD (coronary artery disease)    Closed fracture of shaft of left tibia 07/12/2020   Dementia (HCC)    GERD (gastroesophageal reflux disease)    HLD (hyperlipidemia)    HTN (hypertension)     Past Surgical History:  Procedure Laterality Date   PACEMAKER IMPLANT N/A 07/19/2020   Procedure: PACEMAKER IMPLANT;  Surgeon: Lanier Prude, MD;  Location: [facility] INVASIVE CV LAB;  Service: Cardiovascular;  Laterality: N/A;   unknown      Current Medications: Current Meds  Medication Sig   acetaminophen (TYLENOL) 500 MG tablet Take 500 mg by mouth See admin instructions. Take one tablet (500 mg) by mouth three times daily - morning, supper and bedtime   amLODipine (NORVASC) 5 MG tablet Take 5 mg by mouth every morning.   atorvastatin (LIPITOR) 20 MG tablet Take 20 mg by mouth every morning.   cetirizine (ZYRTEC) 10 MG tablet Take 10 mg by mouth at bedtime.   clopidogrel (PLAVIX) 75 MG tablet Take 75 mg by mouth every morning.   colchicine 0.6 MG tablet Take 1 tablet (0.6 mg total) by mouth daily.   donepezil (ARICEPT) 10 MG tablet Take 10 mg by mouth 2 (two) times daily.   furosemide (LASIX) 40 MG tablet Take 40 mg by mouth every Monday, Wednesday, and Friday.   gabapentin (NEURONTIN) 400 MG capsule Take 800 mg by mouth daily after supper.   melatonin 3 MG TABS tablet Take 1 tablet (3 mg total) by mouth at  bedtime as needed.   montelukast (SINGULAIR) 10 MG tablet Take 10 mg by mouth every morning.   Multiple Vitamin (MULTIVITAMIN WITH MINERALS) TABS tablet Take 1 tablet by mouth every morning.   Omega-3 Fatty Acids (FISH OIL) 1000 MG CAPS Take 1,000 mg by mouth See admin instructions. Take one capsule (1000 mg) by mouth three times daily - morning, supper and bedtime   pantoprazole (PROTONIX) 40 MG tablet Take 1 tablet (40 mg total) by mouth daily at 6 (six) AM.   Polyethylene Glycol 3350 (MIRALAX PO) Take 15 mLs by mouth See admin instructions. Mix 1 tbsp (15 ml) in 18 oz water and drink nightly (with 3 tbsp psyllium fiber)   primidone (MYSOLINE) 50 MG tablet Take 0.5 tablets (25 mg total) by mouth at bedtime.   Psyllium (METAMUCIL PO) Take 45 mLs by mouth See admin instructions. Mix 3 tbsp (45 ml) in 18 oz water and drink nightly (with 1 tbsp miralax powder)   senna-docusate (SENOKOT-S) 8.6-50 MG tablet Take 1 tablet by mouth at bedtime as needed for mild constipation or moderate constipation.   tamsulosin (FLOMAX) 0.4 MG CAPS capsule Take 0.4 mg by mouth at bedtime.   traMADol (ULTRAM) 50 MG tablet Take 1 tablet (50 mg total) by mouth every 12 (twelve) hours as needed for moderate pain.  vitamin B-12 (CYANOCOBALAMIN) 500 MCG tablet Take 1,000 mcg by mouth every morning.     Allergies:   Beta adrenergic blockers, Codeine, and Penicillins   Social History   Socioeconomic History   Marital status: Married    Spouse name: Not on file   Number of children: Not on file   Years of education: Not on file   Highest education level: Not on file  Occupational History   Not on file  Tobacco Use   Smoking status: Unknown   Smokeless tobacco: Never  Vaping Use   Vaping Use: Never used  Substance and Sexual Activity   Alcohol use: Not on file   Drug use: Not on file   Sexual activity: Not on file  Other Topics Concern   Not on file  Social History Narrative   Not on file   Social  Determinants of Health   Financial Resource Strain: Not on file  Food Insecurity: Not on file  Transportation Needs: Not on file  Physical Activity: Not on file  Stress: Not on file  Social Connections: Not on file     Family History: The patient's family history is not on file.  ROS:   Please see the history of present illness.    All other systems reviewed and are negative.  EKGs/Labs/Other Studies Reviewed:    The following studies were reviewed today: Prior notes August 09, 2020 device interrogation normal   October 23, 2020 in clinic device interrogation personally reviewed Atrially pacing 16% Presenting rhythm a sensed V sense ERI estimation about 10 years Today decreased lead outputs to reflect chronicity of device to maximize battery longevity   EKG:  The ekg ordered today demonstrates right bundle branch block, left anterior fascicular block, first-degree AV delay  Recent Labs: 07/06/2020: Magnesium 2.1; TSH 1.044 07/18/2020: ALT 28 07/20/2020: BUN 49; Creatinine, Ser 1.88; Hemoglobin 10.8; Platelets 515; Potassium 4.4; Sodium 134  Recent Lipid Panel No results found for: CHOL, TRIG, HDL, CHOLHDL, VLDL, LDLCALC, LDLDIRECT  Physical Exam:    VS:  BP (!) 114/58   Pulse 82   Ht 5\' 10"  (1.778 m)   Wt 160 lb (72.6 kg)   SpO2 96%   BMI 22.96 kg/m     Wt Readings from Last 3 Encounters:  10/23/20 160 lb (72.6 kg)  07/11/20 164 lb 10.9 oz (74.7 kg)     GEN:  Well nourished, well developed in no acute distress HEENT: Normal NECK: No JVD; No carotid bruits LYMPHATICS: No lymphadenopathy CARDIAC: RRR, no murmurs, rubs, gallops.  Pacemaker pocket well-healed RESPIRATORY:  Clear to auscultation without rales, wheezing or rhonchi  ABDOMEN: Soft, non-tender, non-distended MUSCULOSKELETAL:  No edema; No deformity  SKIN: Warm and dry NEUROLOGIC:  Alert and oriented x 3 PSYCHIATRIC:  Normal affect   ASSESSMENT:    1. Symptomatic bradycardia   2. Cardiac  pacemaker in situ    PLAN:    In order of problems listed above:  #Sinus bradycardia post permanent pacemaker Patient is doing well after his permanent pacemaker implant.  Device interrogation shows he is atrially pacing 16% of the time.  Today we decreased the outputs on his leads to maximize battery longevity.  I will recommend that he follows up with me in 1 year or sooner as needed.  Continue remote monitoring.  Follow-up 1 year     Medication Adjustments/Labs and Tests Ordered: Current medicines are reviewed at length with the patient today.  Concerns regarding medicines are outlined above.  Orders  Placed This Encounter  Procedures   EKG 12-Lead    No orders of the defined types were placed in this encounter.    Signed, Steffanie Dunn, MD, [facility] 10/23/2020 12:02 PM    Electrophysiology [facility]

## 2020-10-23 ENCOUNTER — Ambulatory Visit (INDEPENDENT_AMBULATORY_CARE_PROVIDER_SITE_OTHER): Payer: Medicare HMO | Admitting: Cardiology

## 2020-10-23 ENCOUNTER — Other Ambulatory Visit: Payer: Self-pay

## 2020-10-23 VITALS — BP 114/58 | HR 82 | Ht 70.0 in | Wt 160.0 lb

## 2020-10-23 DIAGNOSIS — R001 Bradycardia, unspecified: Secondary | ICD-10-CM | POA: Diagnosis not present

## 2020-10-23 DIAGNOSIS — Z950 Presence of cardiac pacemaker: Secondary | ICD-10-CM | POA: Diagnosis not present

## 2020-10-23 LAB — CUP PACEART REMOTE DEVICE CHECK
Date Time Interrogation Session: 20220919111602
Implantable Lead Implant Date: 20220616
Implantable Lead Implant Date: 20220616
Implantable Lead Location: 753859
Implantable Lead Location: 753860
Implantable Lead Model: 377171
Implantable Lead Model: 377171
Implantable Lead Serial Number: 8000374699
Implantable Lead Serial Number: 8000405951
Implantable Pulse Generator Implant Date: 20220616
Pulse Gen Model: 407145
Pulse Gen Serial Number: 70108841

## 2020-10-23 NOTE — Patient Instructions (Signed)
Medication Instructions:  Your physician recommends that you continue on your current medications as directed. Please refer to the Current Medication list given to you today. *If you need a refill on your cardiac medications before your next appointment, please call your pharmacy*  Lab Work: None ordered. If you have labs (blood work) drawn today and your tests are completely normal, you will receive your results only by: MyChart Message (if you have MyChart) OR A paper copy in the mail If you have any lab test that is abnormal or we need to change your treatment, we will call you to review the results.  Testing/Procedures: None ordered.  Follow-Up: At [facility], you and your health needs are our priority.  As part of our continuing mission to provide you with exceptional heart care, we have created designated Provider Care Teams.  These Care Teams include your primary Cardiologist (physician) and Advanced Practice Providers (APPs -  Physician Assistants and Nurse Practitioners) who all work together to provide you with the care you need, when you need it.  Your next appointment:   Your physician wants you to follow-up in: one year  You may see Lanier Prude, MD or one of the following Advanced Practice Providers on your designated Care Team:   Francis Dowse, [place] Casimiro Needle "Mardelle Matte" [place], [place] You will receive a reminder letter in the mail two months in advance. If you don't receive a letter, please call our office to schedule the follow-up appointment.  Remote monitoring is used to monitor your Pacemaker from home. This monitoring reduces the number of office visits required to check your device to one time per year. It allows [place] to keep an eye on the functioning of your device to ensure it is working properly. You are scheduled for a device check from home on 01/21/2021. You may send your transmission at any time that day. If you have a wireless device, the transmission will be sent  automatically. After your physician reviews your transmission, you will receive a postcard with your next transmission date.

## 2020-10-29 NOTE — Progress Notes (Signed)
Remote pacemaker transmission.   

## 2020-11-01 ENCOUNTER — Encounter: Payer: Self-pay | Admitting: *Deleted

## 2020-11-02 ENCOUNTER — Ambulatory Visit (INDEPENDENT_AMBULATORY_CARE_PROVIDER_SITE_OTHER): Payer: Medicare HMO | Admitting: Neurology

## 2020-11-02 ENCOUNTER — Encounter: Payer: Self-pay | Admitting: Neurology

## 2020-11-02 VITALS — BP 142/74 | HR 93 | Ht 70.0 in | Wt 160.0 lb

## 2020-11-02 DIAGNOSIS — G250 Essential tremor: Secondary | ICD-10-CM

## 2020-11-02 MED ORDER — PRIMIDONE 50 MG PO TABS: 50.0000 mg | ORAL_TABLET | Freq: Two times a day (BID) | ORAL | 11 refills | Status: AC

## 2020-11-02 MED ORDER — PRIMIDONE 50 MG PO TABS: 50.0000 mg | ORAL_TABLET | Freq: Two times a day (BID) | ORAL | 11 refills | Status: DC

## 2020-11-02 NOTE — Progress Notes (Signed)
Chief Complaint  Patient presents with   Tremors    New patient: paper referral: tremors Room 16, nephew Harvie Heck in room      ASSESSMENT AND PLAN  Frank Avila is a 87 y.o. male Bilateral hands posturing tremor  Most consistent with essential tremor  No parkinsonian features,  Previously was treated with propanolol, no longer a candidate for beta-blocker because of bradycardia, status post pacemaker placement  Was on primidone, only 25 mg every night, will increase dosage 50 mg twice a day  Current tremor more of a nuisance for him, really does not put limitation in his daily activity, I have suggested him to evaluate the benefit versus medication side effect, suggested him avoiding polypharmacy treatment  Continue refill by his primary care physician if primidone is helpful, if is not helpful, it is okay to stop the medication after 2 months of trial.   DIAGNOSTIC DATA (LABS, IMAGING, TESTING) - I reviewed patient records, labs, notes, testing and imaging myself where available. Laboratory evaluations in June 2022: Creatinine 1.88, GFR of 34, glucose 136, CBC hemoglobin of 10.8, HCT of 31.7, normal TSH 1.04  MEDICAL HISTORY:  Frank Avila, is a 87 year old male, seen in request by his primary care physician Dr. Karna Christmas, Myrene Galas, accompanied by his niece for evaluation of bilateral hands tremor, initial evaluation was November 02, 2020.  I reviewed and summarized the referring note. PMHX. Wheelchair since 1975. HTN HLD  He was diagnosed with muscular dystrophy, has been wheelchair-bound since 1975, but highly function, was the caregiver of his wife in the past, now lives independently, hypertension, coronary artery disease, status post CABG 1993, hyperlipidemia,  He was admitted to hospital in June 2022, while sitting in chair, he had lightheadedness, passed out, woke up on the ground, was fine hypotensive, bradycardia, pulse rate was in 20-30s, he had 2 further transient  bradycardiac episode during hospitalization, successful dual-chamber permanent pacemaker implantation on July 19, 2020, also suffered left distal tibial and fibular fracture, had ORIF,  Per hospital admission, he was already treated with propanolol, primidone for tremor, beta-blocker was stopped, primidone was continued at 50 mg half tablet daily.  Patient insisted today that his bilateral hands tremor only started after his June incident, intermittent, most noticeable when he is cold, mainly a nuisance for him, really does not put major limitation on him, he can feed himself, put on clothes, use special transfer chair for transfer, and no bowel bladder incontinence, family members take turns to send food in for him, he denies a family history of tremor, has lost sense of smell for a long time, denies difficulty sleeping, also taking gabapentin 800 mg every night for bilateral toes paresthesia, which has been helpful.  PHYSICAL EXAM:   Vitals:   11/02/20 0832  BP: (!) 142/74  Pulse: 93  Weight: 160 lb (72.6 kg)  Height: 5\' 10"  (1.778 m)   Not recorded     Body mass index is 22.96 kg/m.  PHYSICAL EXAMNIATION:  Gen: NAD, conversant, well nourised, well groomed                     Cardiovascular: Regular rate rhythm, no peripheral edema, warm, nontender. Eyes: Conjunctivae clear without exudates or hemorrhage Neck: Supple, no carotid bruits. Pulmonary: Clear to auscultation bilaterally   NEUROLOGICAL EXAM:  MENTAL STATUS: Speech:    Speech is normal; fluent and spontaneous with normal comprehension.  Cognition:     Orientation to time, place and person  Normal recent and remote memory     Normal Attention span and concentration     Normal Language, naming, repeating,spontaneous speech     Fund of knowledge   CRANIAL NERVES: CN II: Visual fields are full to confrontation. Pupils are round equal and briskly reactive to light. CN III, IV, VI: extraocular movement are normal.  No ptosis. CN V: Facial sensation is intact to light touch CN VII: Face is symmetric with normal eye closure  CN VIII: Hearing is normal to causal conversation. CN IX, X: Phonation is normal. CN XI: Head turning and shoulder shrug are intact  MOTOR: He sits in wheelchair, only trace movement of bilateral lower extremity, not antigravity.  Bilateral upper extremity proximal and distal muscle strengths close to normal, normal muscle tone, bulk, mild posturing tremor, most noticeable drawing spiral circle, or signing his name,  REFLEXES: Reflexes are 1 and symmetric at the biceps, triceps, knees, and ankles. Plantar responses are flexor.  SENSORY: Intact to light touch, pinprick and vibratory sensation are intact in fingers and toes.  COORDINATION: There is no trunk or limb dysmetria noted.  GAIT/STANCE: Deferred   REVIEW OF SYSTEMS:  Full 14 system review of systems performed and notable only for as above All other review of systems were negative.   ALLERGIES: Allergies  Allergen Reactions   Beta Adrenergic Blockers Other (See Comments)    Severe bradycardia on 07/2020 admission requiring pacemaker   Codeine Other (See Comments)    Unknown reaction   Other     SULFA    Penicillins Other (See Comments)    Unknown reaction    HOME MEDICATIONS: Current Outpatient Medications  Medication Sig Dispense Refill   acetaminophen (TYLENOL) 500 MG tablet Take 1 tablet by mouth daily.     amLODipine (NORVASC) 5 MG tablet Take 5 mg by mouth every morning.     aspirin 81 MG chewable tablet Chew 81 mg by mouth daily.     atorvastatin (LIPITOR) 20 MG tablet Take 20 mg by mouth every morning.     cetirizine (ZYRTEC) 10 MG tablet Take 10 mg by mouth at bedtime.     cloNIDine (CATAPRES) 0.1 MG tablet Take 0.1 mg by mouth 2 (two) times daily as needed (BP >150-90).     clopidogrel (PLAVIX) 75 MG tablet Take 75 mg by mouth every morning.     colchicine 0.6 MG tablet Take 1 tablet (0.6 mg  total) by mouth daily. 30 tablet 0   donepezil (ARICEPT) 10 MG tablet Take 10 mg by mouth 2 (two) times daily.     furosemide (LASIX) 40 MG tablet Take 40 mg by mouth every Monday, Wednesday, and Friday.     gabapentin (NEURONTIN) 400 MG capsule Take 400 mg by mouth 2 (two) times daily.     ipratropium (ATROVENT) 0.06 % nasal spray Place 2 sprays into both nostrils 3 (three) times daily as needed for rhinitis.     megestrol (MEGACE) 40 MG/ML suspension Take 400 mg by mouth 2 (two) times daily.     melatonin 3 MG TABS tablet Take 1 tablet (3 mg total) by mouth at bedtime as needed. 30 tablet 0   montelukast (SINGULAIR) 10 MG tablet Take 10 mg by mouth every morning.     Multiple Vitamin (MULTIVITAMIN WITH MINERALS) TABS tablet Take 1 tablet by mouth every morning.     Omega-3 Fatty Acids (FISH OIL) 1000 MG CAPS Take 1,000 mg by mouth See admin instructions. Take one capsule (1000 mg)  by mouth three times daily - morning, supper and bedtime     pantoprazole (PROTONIX) 40 MG tablet Take 1 tablet (40 mg total) by mouth daily at 6 (six) AM. 30 tablet 0   Polyethylene Glycol 3350 (MIRALAX PO) Take 15 mLs by mouth See admin instructions. Mix 1 tbsp (15 ml) in 18 oz water and drink nightly (with 3 tbsp psyllium fiber)     primidone (MYSOLINE) 50 MG tablet Take 0.5 tablets (25 mg total) by mouth at bedtime. 30 tablet 0   Psyllium (METAMUCIL PO) Take 45 mLs by mouth See admin instructions. Mix 3 tbsp (45 ml) in 18 oz water and drink nightly (with 1 tbsp miralax powder)     rOPINIRole (REQUIP) 1 MG tablet Take 1 mg by mouth in the morning and at bedtime.     senna-docusate (SENOKOT-S) 8.6-50 MG tablet Take 1 tablet by mouth at bedtime as needed for mild constipation or moderate constipation. 30 tablet 0   tamsulosin (FLOMAX) 0.4 MG CAPS capsule Take 0.4 mg by mouth at bedtime.     traMADol (ULTRAM) 50 MG tablet Take 1 tablet (50 mg total) by mouth every 12 (twelve) hours as needed for moderate pain. 30 tablet  0   traZODone (DESYREL) 100 MG tablet Take 100 mg by mouth at bedtime.     vitamin B-12 (CYANOCOBALAMIN) 500 MCG tablet Take 1,000 mcg by mouth every morning.     No current facility-administered medications for this visit.    PAST MEDICAL HISTORY: Past Medical History:  Diagnosis Date   Acute gout of knee, Right 07/12/2020   Arthritis    Bradycardia    CAD (coronary artery disease)    Closed fracture of shaft of left tibia 07/12/2020   Colon polyps    Dementia (HCC)    Diverticulosis    Elevated PSA    Former tobacco use    GERD (gastroesophageal reflux disease)    HLD (hyperlipidemia)    HTN (hypertension)    MI (myocardial infarction) (HCC)    Progressive muscular dystrophy (HCC)    with severe LE weakness   PVC's (premature ventricular contractions)     PAST SURGICAL HISTORY: Past Surgical History:  Procedure Laterality Date   CORONARY ARTERY BYPASS GRAFT  1993   heart cath stent placement  05/2010   PACEMAKER IMPLANT N/A 07/19/2020   Procedure: PACEMAKER IMPLANT;  Surgeon: Lanier Prude, MD;  Location: [facility];  Service: Cardiovascular;  Laterality: N/A;   ROTATOR CUFF REPAIR     unknown      FAMILY HISTORY: Family History  Problem Relation Age of Onset   Stroke Mother    Heart disease Father    Diabetes Sibling    Stroke Sibling    Heart disease Sibling    Fibromyalgia Daughter     SOCIAL HISTORY: Social History   Socioeconomic History   Marital status: Married    Spouse name: Not on file   Number of children: Not on file   Years of education: Not on file   Highest education level: Not on file  Occupational History   Not on file  Tobacco Use   Smoking status: Former   Smokeless tobacco: Never  Vaping Use   Vaping Use: Never used  Substance and Sexual Activity   Alcohol use: Not on file   Drug use: Not on file   Sexual activity: Not on file  Other Topics Concern   Not on file  Social History Narrative  Not on file    Social Determinants of Health   Financial Resource Strain: Not on file  Food Insecurity: Not on file  Transportation Needs: Not on file  Physical Activity: Not on file  Stress: Not on file  Social Connections: Not on file  Intimate Partner Violence: Not on file      Levert Feinstein, M.D. Ph.D.  [facility] [address] [place], [place] [address] Ph: [phone] Fax: [phone]  CC:  Galvin Proffer, MD [address] [place],  [place] [address]  Galvin Proffer, MD

## 2020-11-06 ENCOUNTER — Telehealth: Payer: Self-pay

## 2020-11-06 NOTE — Telephone Encounter (Signed)
[facility] alert received for AT episode detected Oct 3@ 4:46 am and lasted 4 hours and 52 min. Spontaneously converted @9 :38 am. No OAC. Beta blocker allergy. Appears to be new onset??  Successful telephone encounter to patient to follow up on s/s of AT. Patient symptomatic during event. Was unaware of why he was feeling unwell at the time. Denies chest pain, pressure, palpitations, shortness of breath, or dizziness. C/O feeling "wiped out" secondary to AT in the presence of Muscular Dystrophy. Patient began to feel better after known episode resolved. Denies complaints today. Will route to Dr. for review/recommendations. Patient is compliant with all cardiovascular medications prescribed including plavix.

## 2020-11-07 NOTE — Telephone Encounter (Signed)
Called and spoke with patient, he is agreeable to MyChart/Telephone Visit on 11/08/20 with Jorja Loa, PA.

## 2020-11-07 NOTE — Telephone Encounter (Signed)
Patient called and advised. Patient agreeable.

## 2020-11-08 ENCOUNTER — Ambulatory Visit (HOSPITAL_COMMUNITY)
Admission: RE | Admit: 2020-11-08 | Discharge: 2020-11-08 | Disposition: A | Discharge status: Home / Self Care | Payer: Medicare HMO | Source: Ambulatory Visit | Attending: Physician Assistant | Admitting: Physician Assistant

## 2020-11-08 ENCOUNTER — Other Ambulatory Visit: Payer: Self-pay

## 2020-11-08 ENCOUNTER — Encounter (HOSPITAL_COMMUNITY): Payer: Self-pay | Admitting: Physician Assistant

## 2020-11-08 VITALS — BP 136/60 | Ht 70.0 in | Wt 160.0 lb

## 2020-11-08 DIAGNOSIS — I4891 Unspecified atrial fibrillation: Secondary | ICD-10-CM | POA: Insufficient documentation

## 2020-11-08 DIAGNOSIS — D6869 Other thrombophilia: Secondary | ICD-10-CM | POA: Insufficient documentation

## 2020-11-08 DIAGNOSIS — I480 Paroxysmal atrial fibrillation: Secondary | ICD-10-CM

## 2020-11-08 MED ORDER — WARFARIN SODIUM 5 MG PO TABS
5.0000 mg | ORAL_TABLET | Freq: Every day | ORAL | 0 refills | Status: DC
Start: 2020-11-08 — End: 2020-12-04

## 2020-11-08 NOTE — Addendum Note (Signed)
Encounter addended by: Shona Simpson, RN on: 11/08/2020 3:54 PM  Actions taken: Order list changed

## 2020-11-08 NOTE — Progress Notes (Signed)
Electrophysiology TeleHealth Note   Audio telehealth visit is felt to be most appropriate for this patient at this time.  See consent below from today for patient consent regarding telehealth for the [facility].    Date:  11/08/2020   ID:  [name], DOB February 28, 1928, MRN [id number]  Location: home  Provider location: [address] [place], [place] [address] Evaluation Performed: Follow up  PCP:  Galvin Proffer, MD  Primary Cardiologist:  Dr Bing Matter (new) Primary Electrophysiologist: Dr Lalla Brothers    History of Present Illness: Frank Avila is a 87 y.o. male with a history of CAD s/p CABG, HTN, HLD, spinal muscular dystrophy, CKD, and atrial fibrillation who presents via audio telehealth visit today. Patient was hospitalized 07/2020 for syncope and collapse. He was profoundly bradycardic and had PPM implant. The device clinic received an alert for an afib episode on 11/05/20 lasting almost 5 hours. He did report increased weakness at that time however his symptoms began the day prior, unclear if related to arrhythmia. He feels well today.   Today, he denies symptoms of palpitations, chest pain, shortness of breath, orthopnea, PND, lower extremity edema, claudication, dizziness, presyncope, syncope, bleeding. The patient is tolerating medications without difficulties and is otherwise without complaint today.    Atrial Fibrillation Risk Factors:  he does not have symptoms or diagnosis of sleep apnea. he does not have a history of rheumatic fever.   he has a BMI of Body mass index is 22.96 kg/m.Marland Kitchen Filed Weights   11/08/20 1102  Weight: 72.6 kg    Past Medical History:  Diagnosis Date   Acute gout of knee, Right 07/12/2020   Arthritis    Bradycardia    CAD (coronary artery disease)    Closed fracture of shaft of left tibia 07/12/2020   Colon polyps    Dementia (HCC)    Diverticulosis    Elevated PSA    Former tobacco use    GERD (gastroesophageal  reflux disease)    HLD (hyperlipidemia)    HTN (hypertension)    MI (myocardial infarction) (HCC)    Progressive muscular dystrophy (HCC)    with severe LE weakness   PVC's (premature ventricular contractions)    Past Surgical History:  Procedure Laterality Date   CORONARY ARTERY BYPASS GRAFT  1993   heart cath stent placement  05/2010   PACEMAKER IMPLANT N/A 07/19/2020   Procedure: PACEMAKER IMPLANT;  Surgeon: Lanier Prude, MD;  Location: [facility];  Service: Cardiovascular;  Laterality: N/A;   ROTATOR CUFF REPAIR     unknown       Current Outpatient Medications  Medication Sig Dispense Refill   acetaminophen (TYLENOL) 500 MG tablet Take 1 tablet by mouth daily.     amLODipine (NORVASC) 5 MG tablet Take 5 mg by mouth every morning.     aspirin 81 MG chewable tablet Chew 81 mg by mouth daily.     atorvastatin (LIPITOR) 20 MG tablet Take 20 mg by mouth every morning.     cetirizine (ZYRTEC) 10 MG tablet Take 10 mg by mouth at bedtime.     cloNIDine (CATAPRES) 0.1 MG tablet Take 0.1 mg by mouth 2 (two) times daily as needed (BP >150-90).     donepezil (ARICEPT) 10 MG tablet Take 10 mg by mouth 2 (two) times daily.     furosemide (LASIX) 40 MG tablet Take 40 mg by mouth every Monday, Wednesday, and Friday.  gabapentin (NEURONTIN) 400 MG capsule Take 400 mg by mouth 2 (two) times daily.     ipratropium (ATROVENT) 0.06 % nasal spray Place 2 sprays into both nostrils 3 (three) times daily as needed for rhinitis.     melatonin 3 MG TABS tablet Take 1 tablet (3 mg total) by mouth at bedtime as needed. 30 tablet 0   Multiple Vitamin (MULTIVITAMIN WITH MINERALS) TABS tablet Take 1 tablet by mouth every morning.     Omega-3 Fatty Acids (FISH OIL) 1000 MG CAPS Take 1,000 mg by mouth See admin instructions. Take one capsule (1000 mg) by mouth three times daily - morning, supper and bedtime     Polyethylene Glycol 3350 (MIRALAX PO) Take 15 mLs by mouth See admin instructions.  Mix 1 tbsp (15 ml) in 18 oz water and drink nightly (with 3 tbsp psyllium fiber)     primidone (MYSOLINE) 50 MG tablet Take 1 tablet (50 mg total) by mouth 2 (two) times daily. 60 tablet 11   Psyllium (METAMUCIL PO) Take 45 mLs by mouth See admin instructions. Mix 3 tbsp (45 ml) in 18 oz water and drink nightly (with 1 tbsp miralax powder)     rOPINIRole (REQUIP) 1 MG tablet Take 1 mg by mouth in the morning and at bedtime.     vitamin B-12 (CYANOCOBALAMIN) 500 MCG tablet Take 1,000 mcg by mouth every morning.     No current facility-administered medications for this encounter.    Allergies:   Beta adrenergic blockers, Codeine, Other, and Penicillins   Social History:  The patient  reports that he has quit smoking. He has never used smokeless tobacco. He reports that he does not currently use alcohol. He reports that he does not use drugs.   Family History:  The patient's  family history includes Diabetes in his sibling; Fibromyalgia in his daughter; Heart disease in his father and sibling; Stroke in his mother and sibling.    ROS:  Please see the history of present illness.   All other systems are personally reviewed and negative.   Vitals Vitals with BMI 11/08/2020  Height 5\' 10"   Weight 160 lbs  BMI 22.96  Systolic 136  Diastolic 60  Pulse -     Recent Labs: 07/06/2020: Magnesium 2.1; TSH 1.044 07/18/2020: ALT 28 07/20/2020: BUN 49; Creatinine, Ser 1.88; Hemoglobin 10.8; Platelets 515; Potassium 4.4; Sodium 134  personally reviewed    Echo 07/07/20  1. Left ventricular ejection fraction, by estimation, is 60 to 65%. The  left ventricle has normal function. The left ventricle has no regional  wall motion abnormalities. Left ventricular diastolic parameters are  consistent with Grade II diastolic dysfunction (pseudonormalization). Elevated left atrial pressure.   2. Right ventricular systolic function is normal. The right ventricular  size is normal.   3. The mitral valve is  normal in structure. No evidence of mitral valve  regurgitation. No evidence of mitral stenosis.   4. The aortic valve is normal in structure. Aortic valve regurgitation is  not visualized. Mild aortic valve sclerosis is present, with no evidence  of aortic valve stenosis.   5. The inferior vena cava is normal in size with greater than 50%  respiratory variability, suggesting right atrial pressure of 3 mmHg.     CHA2DS2-VASc Score = 4  The patient's score is based upon: CHF History: 0 HTN History: 1 Diabetes History: 0 Stroke History: 0 Vascular Disease History: 1 Age Score: 2 Gender Score: 0  ASSESSMENT AND PLAN: 1. Paroxysmal Atrial Fibrillation (ICD10:  I48.0) The patient's CHA2DS2-VASc score is 4, indicating a 4.8% annual risk of stroke.   General education about afib provided and questions answered. We also discussed his stroke risk and the risks and benefits of anticoagulation. Will start warfarin for stroke prevention. Unfortunately, he is on primidone which prohibits the use of Eliquis, Xarelto, or Pradaxa.  Will have him establish with anticoagulation clinic for monitoring.  Stop Plavix and continue ASA 81 mg daily.  2. Secondary Hypercoagulable State (ICD10:  D68.69) The patient is at significant risk for stroke/thromboembolism based upon his CHA2DS2-VASc Score of 4.  Start Warfarin (Coumadin).   3. Sinus bradycardia S/p PPM, followed by Dr Lalla Brothers and the device clinic.  4. CAD S/p CABG No anginal symptoms.   Follow-up with Dr Janyce Llanos as scheduled.    Current medicines are reviewed at length with the patient today.   The patient does not have concerns regarding his medicines.  The following changes were made today:  Stop Plavix, start warfarin   Labs/ tests ordered today include:  No orders of the defined types were placed in this encounter.   Patient Risk:  after full review of this patients clinical status, I feel that they are at moderate risk at  this time.   Today, I have spent 15 minutes with the patient with telehealth technology discussing the above.    Dalia Heading PA-C 11/08/2020 11:52 AM  [facility] [address] [place], [place] [address] [phone]   I hereby voluntarily request, consent and authorize the At[facility] Fibrillation Clinic and its employed or contracted physicians, physician assistants, nurse practitioners or other licensed health care professionals (the Practitioner), to provide me with telemedicine health care services (the "Services") as deemed necessary by the treating Practitioner. I acknowledge and consent to receive the Services by the Practitioner via telemedicine. I understand that the telemedicine visit will involve communicating with the Practitioner through live audiovisual communication technology and the disclosure of certain medical information by electronic transmission. I acknowledge that I have been given the opportunity to request an in-person assessment or other available alternative prior to the telemedicine visit and am voluntarily participating in the telemedicine visit.   I understand that I have the right to withhold or withdraw my consent to the use of telemedicine in the course of my care at any time, without affecting my right to future care or treatment, and that the Practitioner or I may terminate the telemedicine visit at any time. I understand that I have the right to inspect all information obtained and/or recorded in the course of the telemedicine visit and may receive copies of available information for a reasonable fee.  I understand that some of the potential risks of receiving the Services via telemedicine include:   Delay or interruption in medical evaluation due to technological equipment failure or disruption;  Information transmitted may not be sufficient (e.g. poor resolution of images) to allow for appropriate medical decision making by the  Practitioner; and/or  In rare instances, security protocols could fail, causing a breach of personal health information.   Furthermore, I acknowledge that it is my responsibility to provide information about my medical history, conditions and care that is complete and accurate to the best of my ability. I acknowledge that Practitioner's advice, recommendations, and/or decision may be based on factors not within their control, such as incomplete or inaccurate data provided by me or distortions of diagnostic images or  specimens that may result from electronic transmissions. I understand that the practice of medicine is not an exact science and that Practitioner makes no warranties or guarantees regarding treatment outcomes. I acknowledge that I will receive a copy of this consent concurrently upon execution via email to the email address I last provided but may also request a printed copy by calling the office of the At[facility] Fibrillation Clinic.  I understand that my insurance will be billed for this visit.   I have read or had this consent read to me.  I understand the contents of this consent, which adequately explains the benefits and risks of the Services being provided via telemedicine.  I have been provided ample opportunity to ask questions regarding this consent and the Services and have had my questions answered to my satisfaction.  I give my informed consent for the services to be provided through the use of telemedicine in my medical care  By participating in this telemedicine visit I agree to the above.

## 2020-11-13 ENCOUNTER — Ambulatory Visit (INDEPENDENT_AMBULATORY_CARE_PROVIDER_SITE_OTHER): Payer: Medicare HMO

## 2020-11-13 ENCOUNTER — Other Ambulatory Visit: Payer: Self-pay

## 2020-11-13 DIAGNOSIS — Z5181 Encounter for therapeutic drug level monitoring: Secondary | ICD-10-CM

## 2020-11-13 DIAGNOSIS — Z7901 Long term (current) use of anticoagulants: Secondary | ICD-10-CM

## 2020-11-13 DIAGNOSIS — Z79899 Other long term (current) drug therapy: Secondary | ICD-10-CM | POA: Insufficient documentation

## 2020-11-13 DIAGNOSIS — I4891 Unspecified atrial fibrillation: Secondary | ICD-10-CM

## 2020-11-13 LAB — POCT INR: INR: 1 — AB (ref 2.0–3.0)

## 2020-11-13 NOTE — Patient Instructions (Addendum)
Description   Start taking 5mg  (1 tablet) of Warfarin daily.  Stay consistent with greens each week  Coumadin Clinic [phone]   A full discussion of the nature of anticoagulants has been carried out.  A benefit risk analysis has been presented to the patient, so that they understand the justification for choosing anticoagulation at this time. The need for frequent and regular monitoring, precise dosage adjustment and compliance is stressed.  Side effects of potential bleeding are discussed.  The patient should avoid any OTC items containing aspirin or ibuprofen, and should avoid great swings in general diet.  Avoid alcohol consumption.  Call if any signs of abnormal bleeding.  Next PT/INR test in 1 week.

## 2020-11-20 ENCOUNTER — Other Ambulatory Visit: Payer: Self-pay

## 2020-11-20 ENCOUNTER — Ambulatory Visit (INDEPENDENT_AMBULATORY_CARE_PROVIDER_SITE_OTHER): Payer: Medicare HMO

## 2020-11-20 DIAGNOSIS — I4891 Unspecified atrial fibrillation: Secondary | ICD-10-CM | POA: Diagnosis not present

## 2020-11-20 DIAGNOSIS — Z7901 Long term (current) use of anticoagulants: Secondary | ICD-10-CM | POA: Diagnosis not present

## 2020-11-20 LAB — POCT INR: INR: 1.5 — AB (ref 2.0–3.0)

## 2020-11-20 NOTE — Patient Instructions (Signed)
Description   Start taking 5mg  (1 tablet) of Warfarin daily EXCEPT 7.5mg  (1.5 tablets) on Tuesdays and Thursdays.  Stay consistent with greens each week  Coumadin Clinic [phone]

## 2020-11-27 ENCOUNTER — Telehealth: Payer: Self-pay

## 2020-11-27 ENCOUNTER — Ambulatory Visit (INDEPENDENT_AMBULATORY_CARE_PROVIDER_SITE_OTHER): Payer: Medicare HMO

## 2020-11-27 ENCOUNTER — Other Ambulatory Visit: Payer: Self-pay

## 2020-11-27 DIAGNOSIS — I4891 Unspecified atrial fibrillation: Secondary | ICD-10-CM | POA: Diagnosis not present

## 2020-11-27 DIAGNOSIS — Z5181 Encounter for therapeutic drug level monitoring: Secondary | ICD-10-CM | POA: Diagnosis not present

## 2020-11-27 DIAGNOSIS — Z7901 Long term (current) use of anticoagulants: Secondary | ICD-10-CM

## 2020-11-27 LAB — POCT INR: INR: 1.8 — AB (ref 2.0–3.0)

## 2020-11-27 NOTE — Telephone Encounter (Signed)
Pt recently started on Warfarin on 11/13/20 and is being seen at [facility]. Pt is alert and oriented x 4 but seems confused about all of his medications. He states he has a very difficult time keeping up with all his different medications and dosages. During his Coumadin Clinic visit I ask if he has a support system or anyone to help him with his medication regimen at home. Pt explained he has a gentleman that comes to help with chores but does not help him with his health or medications. Pt asked if I could help him call his PCP and determine if an order for Home Health could be placed to help him with medications and dosages. I called PCP office and explained pt's situation to RN. PCP office stated they would discuss with Dr Karna Christmas and follow-up as soon as possible. Provided PCP office with [facility] # [phone].

## 2020-11-27 NOTE — Patient Instructions (Signed)
Description   Take 2 tablets today and then continue taking 5mg  (1 tablet) of Warfarin daily EXCEPT 7.5mg  (1.5 tablets) on Tuesdays and Thursdays.  Stay consistent with greens each week  [facility] [phone]

## 2020-11-28 NOTE — Telephone Encounter (Signed)
Routing to [employer] as she spoke to the pt's pcp today

## 2020-11-30 ENCOUNTER — Ambulatory Visit: Payer: No Typology Code available for payment source | Admitting: Neurology

## 2020-12-04 ENCOUNTER — Other Ambulatory Visit: Payer: Self-pay

## 2020-12-04 ENCOUNTER — Ambulatory Visit (INDEPENDENT_AMBULATORY_CARE_PROVIDER_SITE_OTHER): Payer: Medicare HMO

## 2020-12-04 ENCOUNTER — Telehealth: Payer: Self-pay

## 2020-12-04 DIAGNOSIS — M6281 Muscle weakness (generalized): Secondary | ICD-10-CM

## 2020-12-04 DIAGNOSIS — I4891 Unspecified atrial fibrillation: Secondary | ICD-10-CM

## 2020-12-04 DIAGNOSIS — Z87891 Personal history of nicotine dependence: Secondary | ICD-10-CM | POA: Insufficient documentation

## 2020-12-04 DIAGNOSIS — E559 Vitamin D deficiency, unspecified: Secondary | ICD-10-CM

## 2020-12-04 DIAGNOSIS — E039 Hypothyroidism, unspecified: Secondary | ICD-10-CM | POA: Insufficient documentation

## 2020-12-04 DIAGNOSIS — J300 Vasomotor rhinitis: Secondary | ICD-10-CM | POA: Insufficient documentation

## 2020-12-04 DIAGNOSIS — J309 Allergic rhinitis, unspecified: Secondary | ICD-10-CM | POA: Insufficient documentation

## 2020-12-04 DIAGNOSIS — F0390 Unspecified dementia without behavioral disturbance: Secondary | ICD-10-CM | POA: Insufficient documentation

## 2020-12-04 DIAGNOSIS — H90A32 Mixed conductive and sensorineural hearing loss, unilateral, left ear with restricted hearing on the contralateral side: Secondary | ICD-10-CM | POA: Insufficient documentation

## 2020-12-04 DIAGNOSIS — N401 Enlarged prostate with lower urinary tract symptoms: Secondary | ICD-10-CM | POA: Insufficient documentation

## 2020-12-04 DIAGNOSIS — G629 Polyneuropathy, unspecified: Secondary | ICD-10-CM | POA: Insufficient documentation

## 2020-12-04 DIAGNOSIS — E785 Hyperlipidemia, unspecified: Secondary | ICD-10-CM | POA: Insufficient documentation

## 2020-12-04 DIAGNOSIS — I498 Other specified cardiac arrhythmias: Secondary | ICD-10-CM | POA: Insufficient documentation

## 2020-12-04 DIAGNOSIS — Z87440 Personal history of urinary (tract) infections: Secondary | ICD-10-CM | POA: Insufficient documentation

## 2020-12-04 DIAGNOSIS — G9009 Other idiopathic peripheral autonomic neuropathy: Secondary | ICD-10-CM | POA: Insufficient documentation

## 2020-12-04 DIAGNOSIS — F330 Major depressive disorder, recurrent, mild: Secondary | ICD-10-CM

## 2020-12-04 DIAGNOSIS — N390 Urinary tract infection, site not specified: Secondary | ICD-10-CM | POA: Insufficient documentation

## 2020-12-04 DIAGNOSIS — Z461 Encounter for fitting and adjustment of hearing aid: Secondary | ICD-10-CM

## 2020-12-04 DIAGNOSIS — R059 Cough, unspecified: Secondary | ICD-10-CM | POA: Insufficient documentation

## 2020-12-04 DIAGNOSIS — R972 Elevated prostate specific antigen [PSA]: Secondary | ICD-10-CM | POA: Insufficient documentation

## 2020-12-04 DIAGNOSIS — Z741 Need for assistance with personal care: Secondary | ICD-10-CM

## 2020-12-04 DIAGNOSIS — E042 Nontoxic multinodular goiter: Secondary | ICD-10-CM | POA: Insufficient documentation

## 2020-12-04 DIAGNOSIS — K5790 Diverticulosis of intestine, part unspecified, without perforation or abscess without bleeding: Secondary | ICD-10-CM | POA: Insufficient documentation

## 2020-12-04 DIAGNOSIS — Z5181 Encounter for therapeutic drug level monitoring: Secondary | ICD-10-CM | POA: Diagnosis not present

## 2020-12-04 DIAGNOSIS — K635 Polyp of colon: Secondary | ICD-10-CM | POA: Insufficient documentation

## 2020-12-04 DIAGNOSIS — E1121 Type 2 diabetes mellitus with diabetic nephropathy: Secondary | ICD-10-CM | POA: Insufficient documentation

## 2020-12-04 DIAGNOSIS — D518 Other vitamin B12 deficiency anemias: Secondary | ICD-10-CM

## 2020-12-04 DIAGNOSIS — N400 Enlarged prostate without lower urinary tract symptoms: Secondary | ICD-10-CM

## 2020-12-04 DIAGNOSIS — N1832 Chronic kidney disease, stage 3b: Secondary | ICD-10-CM | POA: Insufficient documentation

## 2020-12-04 DIAGNOSIS — E119 Type 2 diabetes mellitus without complications: Secondary | ICD-10-CM | POA: Insufficient documentation

## 2020-12-04 DIAGNOSIS — K219 Gastro-esophageal reflux disease without esophagitis: Secondary | ICD-10-CM | POA: Insufficient documentation

## 2020-12-04 DIAGNOSIS — Z659 Problem related to unspecified psychosocial circumstances: Secondary | ICD-10-CM

## 2020-12-04 DIAGNOSIS — Z7901 Long term (current) use of anticoagulants: Secondary | ICD-10-CM

## 2020-12-04 DIAGNOSIS — R898 Other abnormal findings in specimens from other organs, systems and tissues: Secondary | ICD-10-CM | POA: Insufficient documentation

## 2020-12-04 DIAGNOSIS — R69 Illness, unspecified: Secondary | ICD-10-CM

## 2020-12-04 DIAGNOSIS — I252 Old myocardial infarction: Secondary | ICD-10-CM | POA: Insufficient documentation

## 2020-12-04 DIAGNOSIS — R490 Dysphonia: Secondary | ICD-10-CM | POA: Insufficient documentation

## 2020-12-04 DIAGNOSIS — I6523 Occlusion and stenosis of bilateral carotid arteries: Secondary | ICD-10-CM | POA: Insufficient documentation

## 2020-12-04 DIAGNOSIS — Z7189 Other specified counseling: Secondary | ICD-10-CM

## 2020-12-04 DIAGNOSIS — G4483 Primary cough headache: Secondary | ICD-10-CM

## 2020-12-04 DIAGNOSIS — G7100 Muscular dystrophy, unspecified: Secondary | ICD-10-CM | POA: Insufficient documentation

## 2020-12-04 DIAGNOSIS — I714 Abdominal aortic aneurysm, without rupture, unspecified: Secondary | ICD-10-CM | POA: Insufficient documentation

## 2020-12-04 DIAGNOSIS — I493 Ventricular premature depolarization: Secondary | ICD-10-CM | POA: Insufficient documentation

## 2020-12-04 HISTORY — DX: Illness, unspecified: R69

## 2020-12-04 HISTORY — DX: Vitamin D deficiency, unspecified: E55.9

## 2020-12-04 HISTORY — DX: Other vitamin B12 deficiency anemias: D51.8

## 2020-12-04 HISTORY — DX: Other specified counseling: Z71.89

## 2020-12-04 HISTORY — DX: Benign prostatic hyperplasia without lower urinary tract symptoms: N40.0

## 2020-12-04 HISTORY — DX: Need for assistance with personal care: Z74.1

## 2020-12-04 HISTORY — DX: Primary cough headache: G44.83

## 2020-12-04 HISTORY — DX: Problem related to unspecified psychosocial circumstances: Z65.9

## 2020-12-04 HISTORY — DX: Major depressive disorder, recurrent, mild: F33.0

## 2020-12-04 HISTORY — DX: Muscle weakness (generalized): M62.81

## 2020-12-04 HISTORY — DX: Encounter for fitting and adjustment of hearing aid: Z46.1

## 2020-12-04 LAB — POCT INR: INR: 1.5 — AB (ref 2.0–3.0)

## 2020-12-04 MED ORDER — WARFARIN SODIUM 5 MG PO TABS: 5.0000 mg | ORAL_TABLET | Freq: Every day | ORAL | 3 refills | Status: AC

## 2020-12-04 NOTE — Patient Instructions (Signed)
Description   Take another tablet today (already taken 1 tablet) and 1.5 tablets tomorrow then continue taking 5mg  (1 tablet) of Warfarin daily EXCEPT 7.5mg  (1.5 tablets) on Sundays, Tuesdays and Thursdays.  Stay consistent with greens each week  [facility] [phone]

## 2020-12-04 NOTE — Telephone Encounter (Signed)
Prescription refill request received for warfarin Lov: 11/08/20 Frank Avila)  Next INR check: 12/11/20 Warfarin tablet strength: 5mg   Appropriate dose and refill sent to requested pharmacy.

## 2020-12-04 NOTE — Telephone Encounter (Signed)
Received phone call from [facility] at [facility] concerning patient medication management and need for possible Home Health. Explained to [facility] that patient is very confused about all of his medications at each Anti-coag visit. Pt's first visit with [facility]mad[facility] Clinic was on 11/13/20. Pt had been instructed to begin taking Warfarin on 11/08/20 per [facility] note; however when pt arrived to first appt to have INR checked, pt had NOT taken any Warfarin and seemed very confused about medication instructions. At second INR check visit on 11/20/20, pt had multiple questions concerning his other medications as well as Warfarin and seemed very confused about how/when to take Warfarin. At pt's third INR check visit on 11/27/20 pt expressed need for help with all of his medications and stated he did not have a support system or anyone who could help him with medication management. He also continued to ask questions about his medications and "not sure which pills are in each bottle" Due to pt being on Warfarin, requiring weekly INR checks at anticoag. clinic, and pt's confusion with medication at each visit, pt would greatly benefit from [facility] for his safety and compliance.

## 2020-12-06 ENCOUNTER — Ambulatory Visit: Payer: No Typology Code available for payment source | Admitting: Cardiology

## 2020-12-06 ENCOUNTER — Telehealth: Payer: Self-pay | Admitting: Cardiology

## 2020-12-06 NOTE — Telephone Encounter (Signed)
Katlyn, wants to know if patient should be taking amLODipine (NORVASC) 5 MG tablet, it looks like he took at one time, but is not taking it now.  When she ask the patient directly he told her he does not take that it's just been sitting on the counter.  His BP this morning was 132/78.

## 2020-12-06 NOTE — Telephone Encounter (Signed)
Last we have in our notes patient was taking amlodipine 5 mg daily. However will check with Dr.Krasowski.

## 2020-12-07 NOTE — Telephone Encounter (Signed)
[facility] aware Dr. Bing Matter is ok with Frank Avila staying off amlodpine if blood pressure remains controlled. She will make Frank Avila aware.

## 2020-12-10 ENCOUNTER — Telehealth: Payer: Self-pay

## 2020-12-10 NOTE — Telephone Encounter (Signed)
Received phone call from [facility] with [facility] stating pt is now set up with [facility]. Also received call from [facility], RN with [facility]. Confirmed pt's INR will be checked by [facility] nurse. Provided nurse with Warfarin dosage instructions and order to check INR tomorrow 12/11/20. Instructed for INR results to be called into [facility] Clinic for further dosing instructions. Joretta Bachelor, RN verbalized understanding. Also attempted to call pt to make him aware of Home Health order and no need to come to appt that was scheduled tomorrow for INR check. Called, no answer. LMOM. Was able to contact emergency contact Loel Dubonnet. She stated she would talk to Mr Owczarzak this evening and make sure he understands that Home Health will come to check his INR.

## 2020-12-11 ENCOUNTER — Ambulatory Visit (INDEPENDENT_AMBULATORY_CARE_PROVIDER_SITE_OTHER): Payer: Medicare HMO

## 2020-12-11 DIAGNOSIS — Z7901 Long term (current) use of anticoagulants: Secondary | ICD-10-CM

## 2020-12-11 DIAGNOSIS — Z5181 Encounter for therapeutic drug level monitoring: Secondary | ICD-10-CM | POA: Diagnosis not present

## 2020-12-11 DIAGNOSIS — I4891 Unspecified atrial fibrillation: Secondary | ICD-10-CM | POA: Diagnosis not present

## 2020-12-11 LAB — POCT INR: INR: 1.3 — AB (ref 2.0–3.0)

## 2020-12-11 NOTE — Patient Instructions (Signed)
Description   Spoke with Joretta Bachelor, [facility] RN and patient. Instructed to take 2 tablets today and 2 tablets tomorrow and then START taking 5mg  (1 tablet) of Warfarin daily EXCEPT 7.5mg  (1.5 tablets) on Sundays, Tuesdays, Thursdays and Saturdays. Recheck INR in 1 week.  Stay consistent with greens each week  C[facility] Clinic [phone]

## 2020-12-12 ENCOUNTER — Ambulatory Visit: Payer: No Typology Code available for payment source | Admitting: Cardiology

## 2020-12-18 ENCOUNTER — Ambulatory Visit (INDEPENDENT_AMBULATORY_CARE_PROVIDER_SITE_OTHER): Payer: Medicare HMO | Admitting: Cardiovascular Disease

## 2020-12-18 DIAGNOSIS — Z5181 Encounter for therapeutic drug level monitoring: Secondary | ICD-10-CM | POA: Diagnosis not present

## 2020-12-18 LAB — POCT INR: INR: 1.4 — AB (ref 2.0–3.0)

## 2020-12-18 NOTE — Patient Instructions (Signed)
Description   Spoke with Morrie Sheldon, [facility] RN and patient. Instructed to take warfarin 2 tablets today and 2 tablets tomorrow and then START taking 1.5 tablets of warfarin daily. Recheck INR in 1 week. Pt has been drinking 2 ensures a day. Cou[facility] Clinic [phone]

## 2020-12-26 ENCOUNTER — Telehealth: Payer: Self-pay

## 2020-12-26 NOTE — Telephone Encounter (Signed)
Lpn called and stated they couldn't obtain inr due to pt being deceased on 2021-01-09. I will route this to abigail baynes and michael dapp rn who follow inr and to frances worley in medical records to close out the chart I also dc'd episode.

## 2021-01-03 DEATH — deceased

## 2021-03-15 ENCOUNTER — Ambulatory Visit: Payer: Medicare HMO | Admitting: Cardiology

## 2022-02-17 IMAGING — CR DG CHEST 2V
2 series · 2 of 2 positions shown · non-contrast
Comparison: July 07, 2020

CLINICAL DATA: Cardiac arrhythmia with pacemaker placement

EXAM:
CHEST - 2 VIEW

[chest lat]
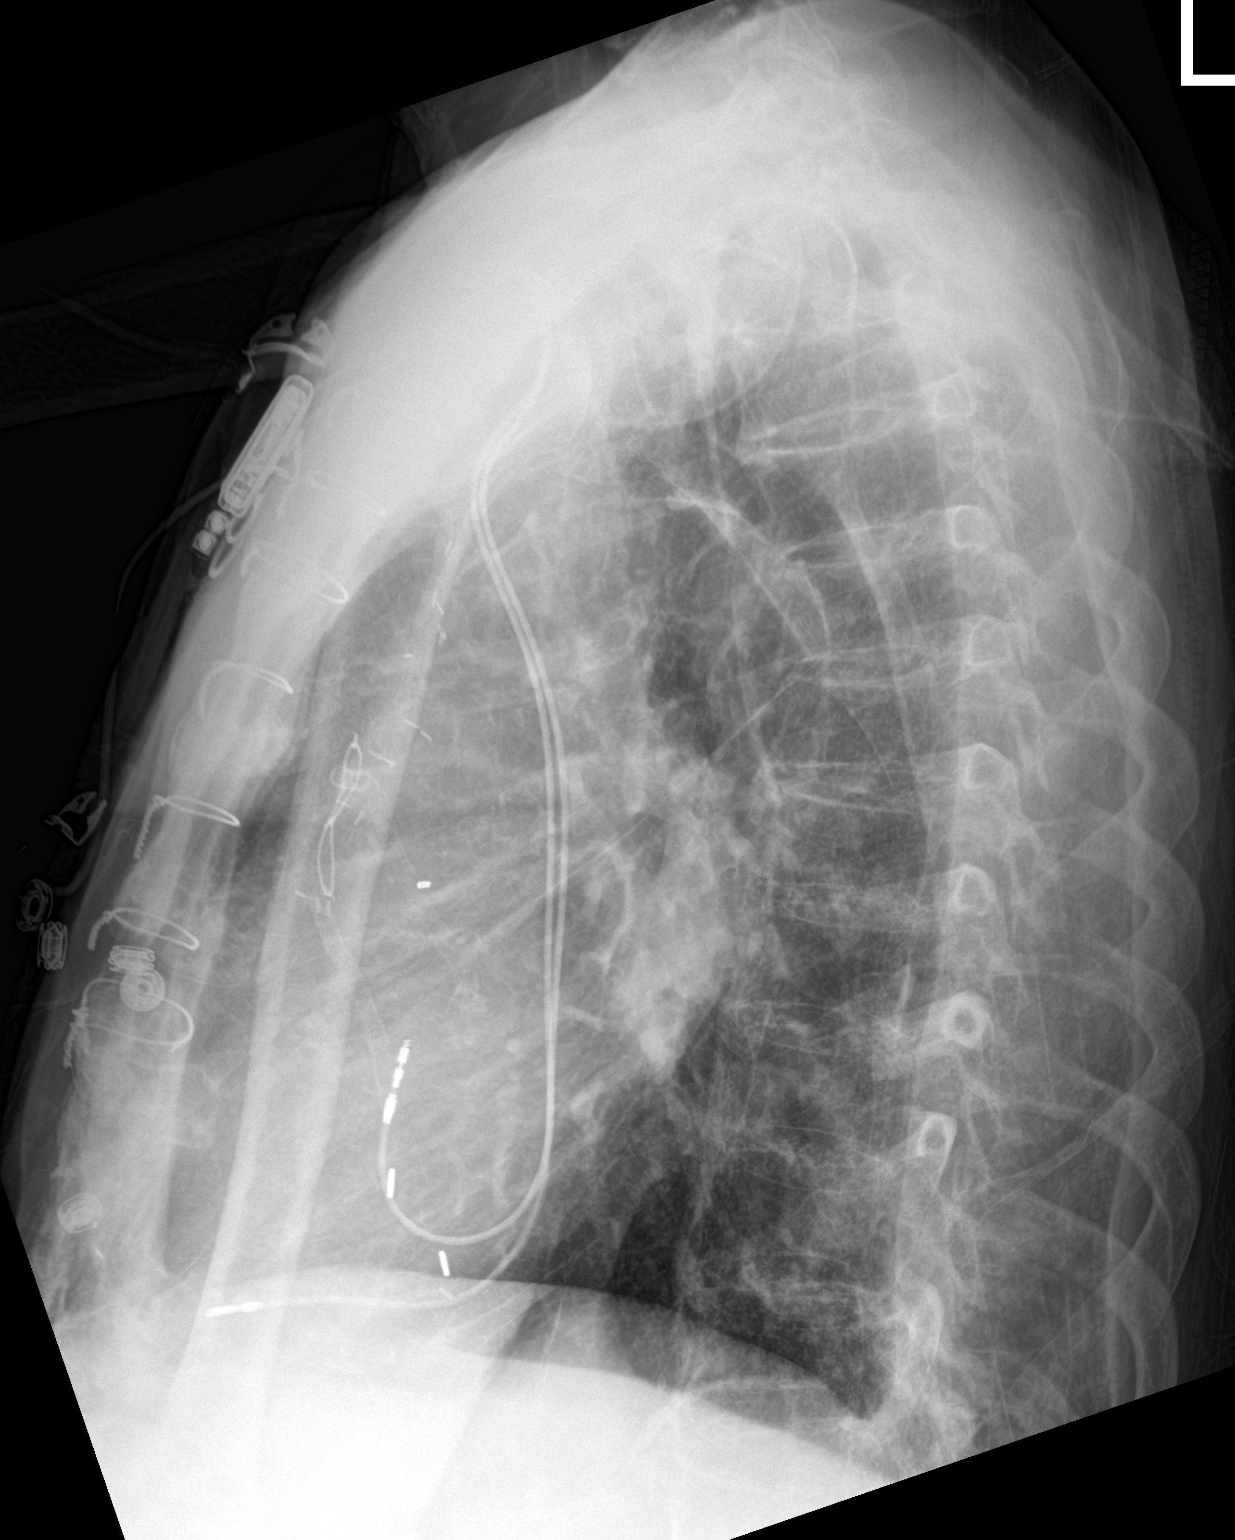

[chest ap]
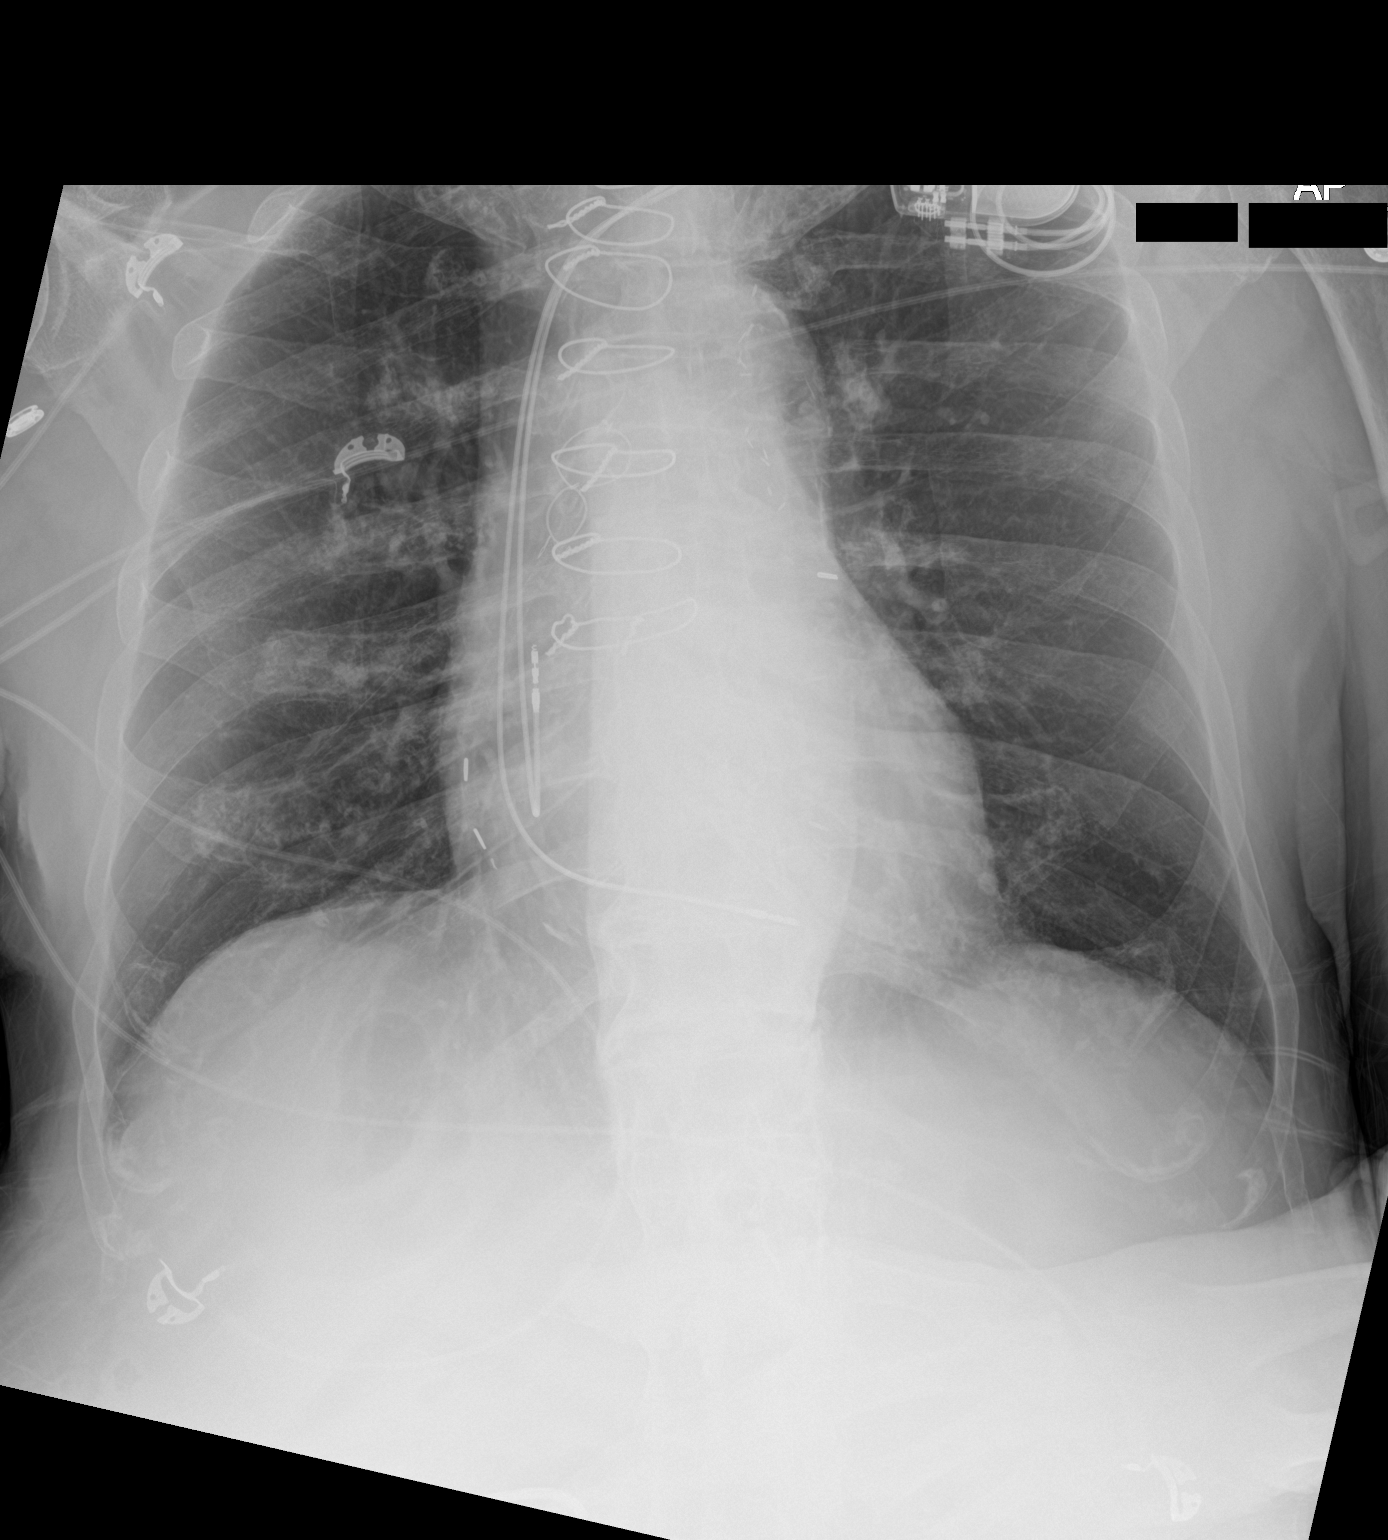

[2 of 2 positions shown; findings below may reference images not displayed]

FINDINGS: There is a pacemaker on the left with lead tips attached to the
right atrium and right ventricle. No pneumothorax. No edema or
airspace opacity. Heart size and pulmonary vascularity are normal.
Patient is status post coronary artery bypass grafting. No
adenopathy. There is aortic atherosclerosis. No bone lesions.
IMPRESSION: Pacemaker present with lead tips attached to right atrium and right
ventricle. No pneumothorax. No edema or airspace opacity. Stable
cardiac silhouette. Postoperative coronary artery bypass grafting.
Aortic Atherosclerosis ([id number]).
